# Patient Record
Sex: Male | Born: 1990 | Race: White | Hispanic: No | Marital: Single | Smoking: Never smoker
Health system: Southern US, Community
[De-identification: ages and names within clinical notes are randomized; demographics above are authoritative.]

## PROBLEM LIST (undated history)

## (undated) DIAGNOSIS — F32A Depression, unspecified: Secondary | ICD-10-CM

## (undated) DIAGNOSIS — Z87898 Personal history of other specified conditions: Secondary | ICD-10-CM

## (undated) DIAGNOSIS — F329 Major depressive disorder, single episode, unspecified: Secondary | ICD-10-CM

## (undated) HISTORY — PX: KNEE SURGERY: SHX244

## (undated) HISTORY — DX: Depression, unspecified: F32.A

## (undated) HISTORY — PX: MOUTH SURGERY: SHX715

## (undated) HISTORY — PX: REFRACTIVE SURGERY: SHX103

---
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
## 2018-06-06 ENCOUNTER — Other Ambulatory Visit: Payer: Self-pay

## 2018-06-06 ENCOUNTER — Ambulatory Visit (INDEPENDENT_AMBULATORY_CARE_PROVIDER_SITE_OTHER): Payer: 59 | Admitting: Osteopathic Medicine

## 2018-06-06 ENCOUNTER — Encounter: Payer: Self-pay | Admitting: Osteopathic Medicine

## 2018-06-06 VITALS — BP 134/82 | HR 69 | Temp 98.6°F | Ht 72.0 in | Wt 222.0 lb

## 2018-06-06 DIAGNOSIS — F418 Other specified anxiety disorders: Secondary | ICD-10-CM

## 2018-06-06 DIAGNOSIS — G8929 Other chronic pain: Secondary | ICD-10-CM

## 2018-06-06 DIAGNOSIS — R002 Palpitations: Secondary | ICD-10-CM | POA: Diagnosis not present

## 2018-06-06 DIAGNOSIS — M25562 Pain in left knee: Secondary | ICD-10-CM | POA: Diagnosis not present

## 2018-06-06 DIAGNOSIS — Z0000 Encounter for general adult medical examination without abnormal findings: Secondary | ICD-10-CM

## 2018-06-06 NOTE — Patient Instructions (Addendum)
Palpitations:  Will get labs today  EKG looks good  Consider heart monitor/ultrasound if labs don't give [place] an obvious cause   General Preventive Care  Most recent routine screening lipids/other labs: ordered today!   Everyone should have blood pressure checked once per year.   Tobacco: don't! Please let me know if you need help quitting!  Alcohol: responsible moderation is ok for most adults - if you have concerns about your alcohol intake, please talk to me!   Exercise: as tolerated to reduce risk of cardiovascular disease and diabetes. Strength training will also prevent osteoporosis.   Mental health: if need for mental health care (medicines, counseling, other), or concerns about moods, please let me know!   Sexual health: if need for STD testing, or if concerns with libido/pain problems, please let me know!   Advanced Directive: Living Will and/or Healthcare Power of Attorney recommended for all adults, regardless of age or health.  Vaccines  Flu vaccine: recommended for almost everyone, every fall/winter  Tetanus booster: Tdap recommended every 10 years.   HPV vaccine: Gardasil recommended up to age 61 to prevent HPV-associated diseases, including certain cancers.  Cancer screenings   Colon cancer screening: recommended for everyone at age 28, but some folks need a colonoscopy sooner if risk factors   Prostate cancer screening: PSA blood test around age 75  Lung cancer screening: not needed for non-smokers  Infection screenings . HIV: recommended screening at least once age 60-65, more often as needed. . Gonorrhea/Chlamydia: screening as needed, though many insurances require testing for anyone on birth control pills. . Hepatitis C: recommended for anyone born 50-1963 . TB: certain at-risk populations, or depending on work requirements and/or travel history

## 2018-06-06 NOTE — Progress Notes (Signed)
HPI: Victor Sandoval is a 28 y.o. male who  has no past medical history on file.  he presents to [facility] today, 06/07/18,  for chief complaint of: New to establish  Requests annual Palpitations     See preventive care reviewed as below.    Additional concerns today include:   Occasional palpitations, typically associated with anxiety.  Seems to be happening more frequently, however.  History of anxiety/depression.  Never has had to be on medications or hospitalized for this issue.  Is not interested in medications or counseling at this time      Past medical, surgical, social and family history reviewed:  There are no active problems to display for this patient.   Past Surgical History:  Procedure Laterality Date  . KNEE SURGERY    . MOUTH SURGERY    . REFRACTIVE SURGERY      Social History   Tobacco Use  . Smoking status: Never Smoker  . Smokeless tobacco: Never Used  Substance Use Topics  . Alcohol use: Yes    Alcohol/week: 5.0 standard drinks    Types: 5 Standard drinks or equivalent per week    Family History  Problem Relation Age of Onset  . High blood pressure Maternal Grandmother   . High blood pressure Maternal Grandfather   . Skin cancer Maternal Grandfather      Current medication list and allergy/intolerance information reviewed:    No current outpatient medications on file.   No current facility-administered medications for this visit.     No Known Allergies    Review of Systems:  Constitutional:  No  fever, no chills, No recent illness, No unintentional weight changes. No significant fatigue.   HEENT: No  headache, no vision change, no hearing change, No sore throat, No  sinus pressure  Cardiac: No  chest pain, No  pressure, + palpitations, No  Orthopnea  Respiratory:  No  shortness of breath. No  Cough  Gastrointestinal: No  abdominal pain, No  nausea, No  vomiting,  No  blood in stool, No   diarrhea, No  constipation   Musculoskeletal: No new myalgia/arthralgia  Skin: No  Rash, No other wounds/concerning lesions  Genitourinary: No  incontinence, No  abnormal genital bleeding, No abnormal genital discharge  Hem/Onc: No  easy bruising/bleeding, No  abnormal lymph node  Endocrine: No cold intolerance,  No heat intolerance. No polyuria/polydipsia/polyphagia   Neurologic: No  weakness, No  dizziness, No  slurred speech/focal weakness/facial droop  Psychiatric: +concerns with depression, + concerns with anxiety, No sleep problems, No mood problems  Exam:  BP 134/82 (BP Location: Left Arm, Patient Position: Sitting, Cuff Size: Normal)   Pulse 69   Temp 98.6 F (37 C) (Oral)   Ht 6' (1.829 m)   Wt 222 lb (100.7 kg)   BMI 30.11 kg/m   Constitutional: VS see above. General Appearance: alert, well-developed, well-nourished, NAD  Eyes: Normal lids and conjunctive, non-icteric sclera  Ears, Nose, Mouth, Throat: MMM, Normal external inspection ears/nares/mouth/lips/gums. TM normal bilaterally. Pharynx/tonsils no erythema, no exudate. Nasal mucosa normal.   Neck: No masses, trachea midline. No thyroid enlargement. No tenderness/mass appreciated. No lymphadenopathy  Respiratory: Normal respiratory effort. no wheeze, no rhonchi, no rales  Cardiovascular: S1/S2 normal, no murmur, no rub/gallop auscultated. RRR. No lower extremity edema.   Gastrointestinal: Nontender, no masses. No hepatomegaly, no splenomegaly. No hernia appreciated. Bowel sounds normal. Rectal exam deferred.   Musculoskeletal: Gait normal. No clubbing/cyanosis of digits.  Neurological: Normal balance/coordination. No tremor. No cranial nerve deficit on limited exam. Motor and sensation intact and symmetric. Cerebellar reflexes intact.   Skin: warm, dry, intact. No rash/ulcer. No concerning nevi or subq nodules on limited exam.    Psychiatric: Normal judgment/insight. Normal mood and affect. Oriented x3.     Results for orders placed or performed in visit on 06/06/18 (from the past 72 hour(s))  CBC     Status: None   Collection Time: 06/06/18 12:19 PM  Result Value Ref Range   WBC 5.5 3.8 - 10.8 Thousand/uL   RBC 5.47 4.20 - 5.80 Million/uL   Hemoglobin 16.1 13.2 - 17.1 g/dL   HCT 98.1 19.1 - 47.8 %   MCV 87.6 80.0 - 100.0 fL   MCH 29.4 27.0 - 33.0 pg   MCHC 33.6 32.0 - 36.0 g/dL   RDW 29.5 62.1 - 30.8 %   Platelets 321 140 - 400 Thousand/uL   MPV 11.9 7.5 - 12.5 fL  COMPLETE METABOLIC PANEL WITH GFR     Status: None   Collection Time: 06/06/18 12:19 PM  Result Value Ref Range   Glucose, Bld 89 65 - 99 mg/dL    Comment: .            Fasting reference interval .    BUN 8 7 - 25 mg/dL   Creat 6.57 8.46 - 9.62 mg/dL   GFR, Est Non African American 96 > OR = 60 mL/min/1.83m2   GFR, Est African American 112 > OR = 60 mL/min/1.59m2   BUN/Creatinine Ratio NOT APPLICABLE 6 - 22 (calc)   Sodium 141 135 - 146 mmol/L   Potassium 4.8 3.5 - 5.3 mmol/L   Chloride 102 98 - 110 mmol/L   CO2 28 20 - 32 mmol/L   Calcium 9.9 8.6 - 10.3 mg/dL   Total Protein 7.4 6.1 - 8.1 g/dL   Albumin 4.9 3.6 - 5.1 g/dL   Globulin 2.5 1.9 - 3.7 g/dL (calc)   AG Ratio 2.0 1.0 - 2.5 (calc)   Total Bilirubin 0.6 0.2 - 1.2 mg/dL   Alkaline phosphatase (APISO) 70 36 - 130 U/L   AST 15 10 - 40 U/L   ALT 14 9 - 46 U/L  Lipid panel     Status: None   Collection Time: 06/06/18 12:19 PM  Result Value Ref Range   Cholesterol 135 <200 mg/dL   HDL 45 > OR = 40 mg/dL   Triglycerides 90 <952 mg/dL   LDL Cholesterol (Calc) 73 mg/dL (calc)    Comment: Reference range: <100 . Desirable range <100 mg/dL for primary prevention;   <70 mg/dL for patients with CHD or diabetic patients  with > or = 2 CHD risk factors. Marland Kitchen LDL-C is now calculated using the Martin-Hopkins  calculation, which is a validated novel method providing  better accuracy than the Friedewald equation in the  estimation of LDL-C.  Horald Pollen et al.  Lenox Ahr. 8413;244(01): 2061-2068  (http://education.[facility].com/faq/FAQ164)    Total CHOL/HDL Ratio 3.0 <5.0 (calc)   Non-HDL Cholesterol (Calc) 90 <027 mg/dL (calc)    Comment: For patients with diabetes plus 1 major ASCVD risk  factor, treating to a non-HDL-C goal of <100 mg/dL  (LDL-C of <25 mg/dL) is considered a therapeutic  option.   TSH     Status: None   Collection Time: 06/06/18 12:19 PM  Result Value Ref Range   TSH 0.72 0.40 - 4.50 mIU/L    No results found.  EKG interpretation: Rate: 66 Rhythm: sinus No ST/T changes concerning for acute ischemia/infarct  Previous EKG none available          ASSESSMENT/PLAN: The primary encounter diagnosis was Annual physical exam. Diagnoses of Palpitation, Depression with anxiety, and Chronic pain of left knee were also pertinent to this visit.   Orders Placed This Encounter  Procedures  . CBC  . COMPLETE METABOLIC PANEL WITH GFR  . Lipid panel  . TSH  . Cardiac event monitor  . EKG 12-Lead      Patient Instructions  Palpitations:  Will get labs today  EKG looks good  Consider heart monitor/ultrasound if labs don't give [place] an obvious cause   General Preventive Care  Most recent routine screening lipids/other labs: ordered today!   Everyone should have blood pressure checked once per year.   Tobacco: don't! Please let me know if you need help quitting!  Alcohol: responsible moderation is ok for most adults - if you have concerns about your alcohol intake, please talk to me!   Exercise: as tolerated to reduce risk of cardiovascular disease and diabetes. Strength training will also prevent osteoporosis.   Mental health: if need for mental health care (medicines, counseling, other), or concerns about moods, please let me know!   Sexual health: if need for STD testing, or if concerns with libido/pain problems, please let me know!   Advanced Directive: Living Will and/or Healthcare Power of  Attorney recommended for all adults, regardless of age or health.  Vaccines  Flu vaccine: recommended for almost everyone, every fall/winter  Tetanus booster: Tdap recommended every 10 years.   HPV vaccine: Gardasil recommended up to age 84 to prevent HPV-associated diseases, including certain cancers.  Cancer screenings   Colon cancer screening: recommended for everyone at age 78, but some folks need a colonoscopy sooner if risk factors   Prostate cancer screening: PSA blood test around age 2  Lung cancer screening: not needed for non-smokers  Infection screenings . HIV: recommended screening at least once age 32-65, more often as needed. . Gonorrhea/Chlamydia: screening as needed, though many insurances require testing for anyone on birth control pills. . Hepatitis C: recommended for anyone born 71-1963 . TB: certain at-risk populations, or depending on work requirements and/or travel history             Visit summary with medication list and pertinent instructions was printed for patient to review. All questions at time of visit were answered - patient instructed to contact office with any additional concerns or updates. ER/RTC precautions were reviewed with the patient.     Please note: voice recognition software was used to produce this document, and typos may escape review. Please contact Dr. Lyn Hollingshead for any needed clarifications.     Follow-up plan: Return in about 1 year (around 06/06/2019) for annual physical - sooner depending on labs / symptoms .

## 2018-06-07 LAB — COMPLETE METABOLIC PANEL WITH GFR
AG RATIO: 2 (calc) (ref 1.0–2.5)
ALT: 14 U/L (ref 9–46)
AST: 15 U/L (ref 10–40)
Albumin: 4.9 g/dL (ref 3.6–5.1)
Alkaline phosphatase (APISO): 70 U/L (ref 36–130)
BUN: 8 mg/dL (ref 7–25)
CO2: 28 mmol/L (ref 20–32)
Calcium: 9.9 mg/dL (ref 8.6–10.3)
Chloride: 102 mmol/L (ref 98–110)
Creat: 1.06 mg/dL (ref 0.60–1.35)
GFR, Est African American: 112 mL/min/{1.73_m2} (ref >=60)
GFR, Est Non African American: 96 mL/min/{1.73_m2} (ref >=60)
GLOBULIN: 2.5 g/dL (ref 1.9–3.7)
Glucose, Bld: 89 mg/dL (ref 65–99)
POTASSIUM: 4.8 mmol/L (ref 3.5–5.3)
Sodium: 141 mmol/L (ref 135–146)
Total Bilirubin: 0.6 mg/dL (ref 0.2–1.2)
Total Protein: 7.4 g/dL (ref 6.1–8.1)

## 2018-06-07 LAB — LIPID PANEL
Cholesterol: 135 mg/dL (ref <200)
HDL: 45 mg/dL (ref >=40)
LDL Cholesterol (Calc): 73 mg/dL (calc)
Non-HDL Cholesterol (Calc): 90 mg/dL (calc) (ref <130)
Total CHOL/HDL Ratio: 3 (calc) (ref <5.0)
Triglycerides: 90 mg/dL (ref <150)

## 2018-06-07 LAB — CBC
HEMATOCRIT: 47.9 % (ref 38.5–50.0)
Hemoglobin: 16.1 g/dL (ref 13.2–17.1)
MCH: 29.4 pg (ref 27.0–33.0)
MCHC: 33.6 g/dL (ref 32.0–36.0)
MCV: 87.6 fL (ref 80.0–100.0)
MPV: 11.9 fL (ref 7.5–12.5)
Platelets: 321 10*3/uL (ref 140–400)
RBC: 5.47 10*6/uL (ref 4.20–5.80)
RDW: 12.6 % (ref 11.0–15.0)
WBC: 5.5 10*3/uL (ref 3.8–10.8)

## 2018-06-07 LAB — TSH: TSH: 0.72 mIU/L (ref 0.40–4.50)

## 2018-09-20 ENCOUNTER — Encounter: Payer: Self-pay | Admitting: Osteopathic Medicine

## 2018-12-08 ENCOUNTER — Encounter: Payer: Self-pay | Admitting: Osteopathic Medicine

## 2018-12-08 ENCOUNTER — Ambulatory Visit (INDEPENDENT_AMBULATORY_CARE_PROVIDER_SITE_OTHER): Payer: 59 | Admitting: Osteopathic Medicine

## 2018-12-08 ENCOUNTER — Other Ambulatory Visit: Payer: Self-pay

## 2018-12-08 VITALS — BP 140/85 | HR 67 | Temp 98.5°F | Wt 180.0 lb

## 2018-12-08 DIAGNOSIS — L659 Nonscarring hair loss, unspecified: Secondary | ICD-10-CM | POA: Diagnosis not present

## 2018-12-08 DIAGNOSIS — Z113 Encounter for screening for infections with a predominantly sexual mode of transmission: Secondary | ICD-10-CM

## 2018-12-08 DIAGNOSIS — R5382 Chronic fatigue, unspecified: Secondary | ICD-10-CM | POA: Diagnosis not present

## 2018-12-08 MED ORDER — FINASTERIDE 1 MG PO TABS: 1.0000 mg | ORAL_TABLET | Freq: Every day | ORAL | 3 refills | Status: DC

## 2018-12-08 MED ORDER — MINOXIDIL FOR MEN 5 % EX FOAM: 1.0000 "application " | Freq: Two times a day (BID) | CUTANEOUS | 11 refills | Status: DC

## 2018-12-08 NOTE — Progress Notes (Signed)
HPI: Victor Sandoval is a 28 y.o. male who  has no past medical history on file.  he presents to [facility] today, 12/08/18,  for chief complaint of: STD screening, testosterone check  Patient interested in STD screening for routine purposes. Patient denies any history of rash, itching, burning in the genital are, mouth or genital ulcers. Would also like testosterone checked given recent fatigue/lack of energy and thinning hair. Patient interested in medication to treat hair loss.    At today's visit 12/08/18 ... PMH, PSH, FH reviewed and updated as needed.  Current medication list and allergy/intolerance hx reviewed and updated as needed. (See remainder of HPI, ROS, Phys Exam below)  No results found.  No results found for this or any previous visit (from the past 72 hour(s)).        ASSESSMENT/PLAN: The encounter diagnosis was Screening for STDs (sexually transmitted diseases).  Ordered STD panel   Will check testosterone, TSH, CMP to evaluate fatigue    Patient will start minoxidil and finasteride for hair loss   Orders Placed This Encounter  Procedures  . C. trachomatis/N. gonorrhoeae RNA  . HIV Antibody (routine testing w rflx)  . RPR  . CBC  . COMPLETE METABOLIC PANEL WITH GFR  . TSH  . Testosterone     Meds ordered this encounter  Medications  . finasteride (PROPECIA) 1 MG tablet    Sig: Take 1 tablet (1 mg total) by mouth daily.    Dispense:  90 tablet    Refill:  3  . Minoxidil (MINOXIDIL FOR MEN) 5 % FOAM    Sig: Apply 1 application topically 2 (two) times daily.    Dispense:  60 g    Refill:  11    There are no Patient Instructions on file for this visit.    Follow-up plan: Return for RECHECK PENDING RESULTS / IF WORSE OR  CHANGE.                                                 ################################################# ################################################# ################################################# #################################################    No outpatient medications have been marked as taking for the 12/08/18 encounter (Office Visit) with Emeterio Reeve, DO.    No Known Allergies     Review of Systems:  Constitutional: No recent illness, +fatigue  Genitourinary: No genital rashes or lesions, no itching, burning, or changes in urination   Musculoskeletal: No new myalgia/arthralgia  Psychiatric: No  concerns with depression, No  concerns with anxiety  Exam:  BP 140/85 (BP Location: Left Arm, Patient Position: Sitting, Cuff Size: Normal)   Pulse 67   Temp 98.5 F (36.9 C) (Oral)   Wt 180 lb (81.6 kg)   BMI 24.41 kg/m   Constitutional: VS see above. General Appearance: alert, well-developed, well-nourished, NAD  Neck: No masses, trachea midline.   Respiratory: Normal respiratory effort.   Musculoskeletal: Gait normal.   Neurological: Normal balance/coordination. No tremor.  Skin: warm, dry, intact.   Psychiatric: Normal judgment/insight. Normal mood and affect. Oriented x3.   Visit summary with medication list and pertinent instructions was printed for patient to review, patient was advised to alert [place] if any updates are needed. All questions at time of visit were answered - patient instructed to contact office with any additional concerns. ER/RTC precautions were reviewed with the patient and understanding verbalized.  Note: Total time spent 25 minutes, greater than 50% of the visit was spent face-to-face counseling and coordinating care for the following: The encounter diagnosis was Screening for STDs (sexually transmitted diseases)..  Please note: voice recognition software was used to produce  this document, and typos may escape review. Please contact Dr. Lyn HollingsheadAlexander for any needed clarifications.    Follow up plan: Return for RECHECK PENDING RESULTS / IF WORSE OR CHANGE.

## 2018-12-09 LAB — C. TRACHOMATIS/N. GONORRHOEAE RNA
C. trachomatis RNA, TMA: NOT DETECTED
N. gonorrhoeae RNA, TMA: NOT DETECTED

## 2018-12-10 LAB — COMPLETE METABOLIC PANEL WITH GFR
AG Ratio: 2 (calc) (ref 1.0–2.5)
ALT: 11 U/L (ref 9–46)
AST: 14 U/L (ref 10–40)
Albumin: 4.7 g/dL (ref 3.6–5.1)
Alkaline phosphatase (APISO): 65 U/L (ref 36–130)
BUN: 7 mg/dL (ref 7–25)
CO2: 29 mmol/L (ref 20–32)
Calcium: 9.9 mg/dL (ref 8.6–10.3)
Chloride: 100 mmol/L (ref 98–110)
Creat: 0.9 mg/dL (ref 0.60–1.35)
GFR, Est African American: 136 mL/min/{1.73_m2} (ref >=60)
GFR, Est Non African American: 117 mL/min/{1.73_m2} (ref >=60)
Globulin: 2.3 g/dL (calc) (ref 1.9–3.7)
Glucose, Bld: 85 mg/dL (ref 65–99)
Potassium: 4.1 mmol/L (ref 3.5–5.3)
Sodium: 139 mmol/L (ref 135–146)
Total Bilirubin: 0.8 mg/dL (ref 0.2–1.2)
Total Protein: 7 g/dL (ref 6.1–8.1)

## 2018-12-10 LAB — CBC
HCT: 49.7 % (ref 38.5–50.0)
Hemoglobin: 16.3 g/dL (ref 13.2–17.1)
MCH: 29.7 pg (ref 27.0–33.0)
MCHC: 32.8 g/dL (ref 32.0–36.0)
MCV: 90.7 fL (ref 80.0–100.0)
MPV: 11.6 fL (ref 7.5–12.5)
Platelets: 316 10*3/uL (ref 140–400)
RBC: 5.48 10*6/uL (ref 4.20–5.80)
RDW: 12.9 % (ref 11.0–15.0)
WBC: 7.6 10*3/uL (ref 3.8–10.8)

## 2018-12-10 LAB — TSH: TSH: 1.25 mIU/L (ref 0.40–4.50)

## 2018-12-10 LAB — RPR: RPR Ser Ql: NONREACTIVE

## 2018-12-10 LAB — HIV ANTIBODY (ROUTINE TESTING W REFLEX): HIV 1&2 Ab, 4th Generation: NONREACTIVE

## 2018-12-10 LAB — TESTOSTERONE: Testosterone: 599 ng/dL (ref 250–827)

## 2019-03-16 ENCOUNTER — Encounter: Payer: Self-pay | Admitting: Osteopathic Medicine

## 2019-03-16 ENCOUNTER — Encounter: Payer: Self-pay | Admitting: Gastroenterology

## 2019-03-16 ENCOUNTER — Telehealth (INDEPENDENT_AMBULATORY_CARE_PROVIDER_SITE_OTHER): Payer: Self-pay | Admitting: Osteopathic Medicine

## 2019-03-16 VITALS — Wt 179.0 lb

## 2019-03-16 DIAGNOSIS — R21 Rash and other nonspecific skin eruption: Secondary | ICD-10-CM

## 2019-03-16 DIAGNOSIS — Z8249 Family history of ischemic heart disease and other diseases of the circulatory system: Secondary | ICD-10-CM

## 2019-03-16 DIAGNOSIS — K625 Hemorrhage of anus and rectum: Secondary | ICD-10-CM

## 2019-03-16 MED ORDER — HYDROCORTISONE ACETATE 25 MG RE SUPP: 25.0000 mg | Freq: Two times a day (BID) | RECTAL | 2 refills | Status: DC | PRN

## 2019-03-16 NOTE — Progress Notes (Signed)
Virtual Visit via Video (App used: MyChart) Note  I connected with      Victor Sandoval on 03/16/19 at 10:16 AM  by a telemedicine application and verified that I am speaking with the correct person using two identifiers.  Patient is at home I am in office   I discussed the limitations of evaluation and management by telemedicine and the availability of in person appointments. The patient expressed understanding and agreed to proceed.  History of Present Illness: Victor Sandoval is a 28 y.o. male who would like to discuss rectal bleed, rash, FH blood clots   Rectal bleeding on and off for about 8 years, sore but not painful.  No history of fissure.  Thinks probably hemorrhoids.  Would like referral to get looked at/fixed.  Rash: Itchy hives-like reaction, not sure what might of triggered it but it is gone at this point.  Sister has sensitivity to lactose that has developed into an allergy, patient would like to get some allergy testing completed.  Family history of blood clots on mom's side, uncle and cousin, unknown if trigger or underlying disorder.  No first-degree relatives with a history of blood clot.  Patient's mom asked him to ask me about this issue  Inquires about Covid testing for asymptomatic    Observations/Objective: Wt 179 lb (81.2 kg)   BMI 24.28 kg/m  BP Readings from Last 3 Encounters:  12/08/18 140/85  06/06/18 134/82   Exam: Normal Speech.  NAD  Lab and Radiology Results No results found for this or any previous visit (from the past 72 hour(s)). No results found.     Assessment and Plan: 28 y.o. male with The primary encounter diagnosis was Rectal bleeding. Diagnoses of Rash and nonspecific skin eruption and Family history of blood clots were also pertinent to this visit.  Advised him to look into the blood clot issue regarding family history.  I do not see any reason to initiate a hypercoagulability work-up in him since no first-degree history, no  known genetic disorder, no personal history of of clotting.  We will go ahead and refer to GI to evaluate rectal bleeding, description is consistent with hemorrhoids, Anusol sent as below.  Will get lab testing for basic food allergy profile.  This might hel [place] in an elimination diet, depending on symptoms/response to elimination diet may consider referral to allergy for skin testing  Advised we can do testing for Covid for asymptomatic patients prior to travel, however we are very short on swabs.  He is advised to try to find a different testing site if possible, let me know if he is not successful and we will try to figure something out.   PDMP not reviewed this encounter. Orders Placed This Encounter  Procedures  . Food Allergy Profile  . Ambulatory referral to Gastroenterology    Referral Priority:   Routine    Referral Type:   Consultation    Referral Reason:   Specialty Services Required    Number of Visits Requested:   1   Meds ordered this encounter  Medications  . hydrocortisone (ANUSOL-HC) 25 MG suppository    Sig: Place 1 suppository (25 mg total) rectally 2 (two) times daily as needed for hemorrhoids.    Dispense:  12 suppository    Refill:  2    Follow Up Instructions: Return if symptoms worsen or fail to improve.    I discussed the assessment and treatment plan with the patient. The patient was provided  an opportunity to ask questions and all were answered. The patient agreed with the plan and demonstrated an understanding of the instructions.   The patient was advised to call back or seek an in-person evaluation if any new concerns, if symptoms worsen or if the condition fails to improve as anticipated.  25 minutes of non-face-to-face time was provided during this encounter.      . . . . . . . . . . . . . Marland Kitchen                   Historical information moved to improve visibility of documentation.  No past medical history on  file. Past Surgical History:  Procedure Laterality Date  . KNEE SURGERY    . MOUTH SURGERY    . REFRACTIVE SURGERY     Social History   Tobacco Use  . Smoking status: Never Smoker  . Smokeless tobacco: Never Used  Substance Use Topics  . Alcohol use: Yes    Alcohol/week: 5.0 standard drinks    Types: 5 Standard drinks or equivalent per week   family history includes High blood pressure in his maternal grandfather and maternal grandmother; Skin cancer in his maternal grandfather.  Medications: Current Outpatient Medications  Medication Sig Dispense Refill  . finasteride (PROPECIA) 1 MG tablet Take 1 tablet (1 mg total) by mouth daily. 90 tablet 3  . Minoxidil (MINOXIDIL FOR MEN) 5 % FOAM Apply 1 application topically 2 (two) times daily. 60 g 11  . hydrocortisone (ANUSOL-HC) 25 MG suppository Place 1 suppository (25 mg total) rectally 2 (two) times daily as needed for hemorrhoids. 12 suppository 2   No current facility-administered medications for this visit.   No Known Allergies

## 2019-03-17 LAB — FOOD ALLERGY PROFILE
Allergen, Salmon, f41: <0.1 kU/L
Almonds: <0.1 kU/L
CLASS: 0
CLASS: 0
CLASS: 0
CLASS: 0
CLASS: 0
CLASS: 0
CLASS: 0
CLASS: 0
CLASS: 0
CLASS: 0
CLASS: 0
Cashew IgE: <0.1 kU/L
Class: 0
Class: 0
Class: 0
Egg White IgE: <0.1 kU/L
Fish Cod: <0.1 kU/L
Hazelnut: <0.1 kU/L
Milk IgE: <0.1 kU/L
Peanut IgE: <0.1 kU/L
Scallop IgE: <0.1 kU/L
Sesame Seed f10: <0.1 kU/L
Shrimp IgE: 0.13 kU/L — ABNORMAL HIGH
Soybean IgE: <0.1 kU/L
Tuna IgE: <0.1 kU/L
Walnut: <0.1 kU/L
Wheat IgE: <0.1 kU/L

## 2019-03-17 LAB — INTERPRETATION:

## 2019-03-27 DIAGNOSIS — J9383 Other pneumothorax: Secondary | ICD-10-CM

## 2019-03-27 HISTORY — DX: Other pneumothorax: J93.83

## 2019-04-16 ENCOUNTER — Other Ambulatory Visit: Payer: Self-pay

## 2019-04-16 ENCOUNTER — Encounter: Payer: Self-pay | Admitting: Gastroenterology

## 2019-04-16 ENCOUNTER — Ambulatory Visit (INDEPENDENT_AMBULATORY_CARE_PROVIDER_SITE_OTHER): Payer: 59 | Admitting: Gastroenterology

## 2019-04-16 VITALS — BP 110/68 | HR 61 | Temp 98.2°F | Ht 72.0 in | Wt 189.5 lb

## 2019-04-16 DIAGNOSIS — K625 Hemorrhage of anus and rectum: Secondary | ICD-10-CM | POA: Diagnosis not present

## 2019-04-16 DIAGNOSIS — R634 Abnormal weight loss: Secondary | ICD-10-CM | POA: Diagnosis not present

## 2019-04-16 DIAGNOSIS — Z01818 Encounter for other preprocedural examination: Secondary | ICD-10-CM | POA: Diagnosis not present

## 2019-04-16 NOTE — Patient Instructions (Signed)
If you are age 27 or older, your body mass index should be between 23-30. Your Body mass index is 25.7 kg/m. If this is out of the aforementioned range listed, please consider follow up with your Primary Care Provider.  If you are age 87 or younger, your body mass index should be between 19-25. Your Body mass index is 25.7 kg/m. If this is out of the aformentioned range listed, please consider follow up with your Primary Care Provider.    You have been scheduled for a colonoscopy. Please follow written instructions given to you at your visit today.  Please pick up your prep supplies at the pharmacy within the next 1-3 days. If you use inhalers (even only as needed), please bring them with you on the day of your procedure. Your physician has requested that you go to www.startemmi.com and enter the access code given to you at your visit today. This web site gives a general overview about your procedure. However, you should still follow specific instructions given to you by our office regarding your preparation for the procedure.  Thank you,  Dr. Lynann Bologna

## 2019-04-16 NOTE — Progress Notes (Signed)
Chief Complaint:   Referring Provider:  Emeterio Reeve, DO      ASSESSMENT AND PLAN;   #1. Rectal bleeding. D/d hoids, colitis, polyps, stercoral ulcers etc, r/o colonic neoplasms or IBD.  #2. Wt loss  Plan: - Proceed with colonoscopy. Discussed risks & benefits. (Risks including rare perforation req laparotomy, bleeding after bx/polypectomy req blood transfusion, rarely missing neoplasms, risks of anesthesia/sedation). Benefits outweigh the risks. Patient agrees to proceed. All the questions were answered. Consent forms given for review.    HPI:    Victor Sandoval is a 29 y.o. male  With rectal bleeding since 2009, getting worse now.  Mostly bright red blood.  At times mixed with the stool and at times dripping.  Has some abdominal discomfort and mild rectal discomfort. CBC was within normal limits. Has occasional history of constipation but not bad. Previously did use nonsteroidals which she has stopped.  Bleeding continues. No fever chills or night sweats.  No skin rash. No family history of IBD or colon cancers.  Has lost 40 pounds in the last 2 months.  Some of the weight loss has been intentional but not entirely.  Has been quite concerned about neoplasms and about weight loss.  Has good appetite.  Denies having any nausea, vomiting, heartburn, melena or regurgitation.  No jaundice dark urine or pale stools.  Does give history suggestive of lactose intolerance.  He would get constipated with cheese and milk.  Rarely would have diarrhea as well.  Has tried Lactaid milk with similar problems.  Was tested negative for food allergies except for shrimp.   Past Medical History:  Diagnosis Date  . Depression     Past Surgical History:  Procedure Laterality Date  . KNEE SURGERY    . MOUTH SURGERY     wisdom tooth and tongue tied  . REFRACTIVE SURGERY      Family History  Problem Relation Age of Onset  . High blood pressure Maternal Grandmother   . High blood  pressure Maternal Grandfather   . Skin cancer Maternal Grandfather   . Clotting disorder Maternal Uncle   . Colon cancer Neg Hx   . Esophageal cancer Neg Hx     Social History   Tobacco Use  . Smoking status: Never Smoker  . Smokeless tobacco: Never Used  Substance Use Topics  . Alcohol use: Not Currently    Alcohol/week: 5.0 standard drinks    Types: 5 Standard drinks or equivalent per week  . Drug use: Never    Current Outpatient Medications  Medication Sig Dispense Refill  . finasteride (PROPECIA) 1 MG tablet Take 1 tablet (1 mg total) by mouth daily. 90 tablet 3  . Minoxidil (MINOXIDIL FOR MEN) 5 % FOAM Apply 1 application topically 2 (two) times daily. 60 g 11  . hydrocortisone (ANUSOL-HC) 25 MG suppository Place 1 suppository (25 mg total) rectally 2 (two) times daily as needed for hemorrhoids. (Patient not taking: Reported on 04/16/2019) 12 suppository 2   No current facility-administered medications for this visit.    No Known Allergies  Review of Systems:  Constitutional: Denies fever, chills, diaphoresis, appetite change and fatigue.  HEENT: Denies photophobia, eye pain, redness, hearing loss, ear pain, congestion, sore throat, rhinorrhea, sneezing, mouth sores, neck pain, neck stiffness and tinnitus.   Respiratory: Denies SOB, DOE, cough, chest tightness,  and wheezing.   Cardiovascular: Denies chest pain, palpitations and leg swelling.  Genitourinary: Denies dysuria, urgency, frequency, hematuria, flank pain and difficulty urinating.  Musculoskeletal: Denies myalgias, back pain, joint swelling, arthralgias and gait problem.  Skin: No rash.  Neurological: Denies dizziness, seizures, syncope, weakness, light-headedness, numbness and headaches.  Hematological: Denies adenopathy. Easy bruising, personal or family bleeding history  Psychiatric/Behavioral: No anxiety or depression     Physical Exam:    BP 110/68   Pulse 61   Temp 98.2 F (36.8 C)   Ht 6' (1.829  m)   Wt 189 lb 8 oz (86 kg)   BMI 25.70 kg/m  Wt Readings from Last 3 Encounters:  04/16/19 189 lb 8 oz (86 kg)  03/16/19 179 lb (81.2 kg)  12/08/18 180 lb (81.6 kg)   Constitutional:  Well-developed, in no acute distress. Psychiatric: Normal mood and affect. Behavior is normal. HEENT: Pupils normal.  Conjunctivae are normal. No scleral icterus. Neck supple.  Cardiovascular: Normal rate, regular rhythm. No edema Pulmonary/chest: Effort normal and breath sounds normal. No wheezing, rales or rhonchi. Abdominal: Soft, nondistended. Nontender. Bowel sounds active throughout. There are no masses palpable. No hepatomegaly. Rectal: To be performed at the time of colonoscopy. Neurological: Alert and oriented to person place and time. Skin: Skin is warm and dry. No rashes noted.  Data Reviewed: I have personally reviewed following labs and imaging studies  CBC: CBC Latest Ref Rng & Units 12/09/2018 06/06/2018  WBC 3.8 - 10.8 Thousand/uL 7.6 5.5  Hemoglobin 13.2 - 17.1 g/dL 01.7 51.0  Hematocrit 25.8 - 50.0 % 49.7 47.9  Platelets 140 - 400 Thousand/uL 316 321    CMP: CMP Latest Ref Rng & Units 12/09/2018 06/06/2018  Glucose 65 - 99 mg/dL 85 89  BUN 7 - 25 mg/dL 7 8  Creatinine 5.27 - 1.35 mg/dL 7.82 4.23  Sodium 536 - 146 mmol/L 139 141  Potassium 3.5 - 5.3 mmol/L 4.1 4.8  Chloride 98 - 110 mmol/L 100 102  CO2 20 - 32 mmol/L 29 28  Calcium 8.6 - 10.3 mg/dL 9.9 9.9  Total Protein 6.1 - 8.1 g/dL 7.0 7.4  Total Bilirubin 0.2 - 1.2 mg/dL 0.8 0.6  AST 10 - 40 U/L 14 15  ALT 9 - 46 U/L 11 14     Edman Circle, MD 04/16/2019, 11:26 AM  Cc: Sunnie Nielsen, DO

## 2019-04-17 ENCOUNTER — Ambulatory Visit (INDEPENDENT_AMBULATORY_CARE_PROVIDER_SITE_OTHER): Payer: 59

## 2019-04-17 DIAGNOSIS — Z1159 Encounter for screening for other viral diseases: Secondary | ICD-10-CM

## 2019-04-20 LAB — SARS CORONAVIRUS 2 (TAT 6-24 HRS): SARS Coronavirus 2: NEGATIVE

## 2019-04-21 ENCOUNTER — Encounter: Payer: Self-pay | Admitting: Gastroenterology

## 2019-04-21 ENCOUNTER — Ambulatory Visit (AMBULATORY_SURGERY_CENTER): Payer: 59 | Admitting: Gastroenterology

## 2019-04-21 ENCOUNTER — Other Ambulatory Visit: Payer: Self-pay

## 2019-04-21 VITALS — BP 102/66 | HR 61 | Temp 97.1°F | Resp 18 | Ht 72.0 in | Wt 189.0 lb

## 2019-04-21 DIAGNOSIS — K648 Other hemorrhoids: Secondary | ICD-10-CM

## 2019-04-21 DIAGNOSIS — K625 Hemorrhage of anus and rectum: Secondary | ICD-10-CM | POA: Diagnosis not present

## 2019-04-21 MED ORDER — SODIUM CHLORIDE 0.9 % IV SOLN: 500.0000 mL | Freq: Once | INTRAVENOUS | Status: DC

## 2019-04-21 MED ORDER — HYDROCORTISONE (PERIANAL) 2.5 % EX CREA: 1.0000 "application " | TOPICAL_CREAM | Freq: Two times a day (BID) | CUTANEOUS | 1 refills | Status: AC

## 2019-04-21 NOTE — Progress Notes (Signed)
Report to PACU, RN, vss, BBS= Clear.  

## 2019-04-21 NOTE — Patient Instructions (Signed)
YOU HAD AN ENDOSCOPIC PROCEDURE TODAY AT THE [facility]:   Refer to the procedure report that was given to you for any specific questions about what was found during the examination.  If the procedure report does not answer your questions, please call your gastroenterologist to clarify.  If you requested that your care partner not be given the details of your procedure findings, then the procedure report has been included in a sealed envelope for you to review at your convenience later.  YOU SHOULD EXPECT: Some feelings of bloating in the abdomen. Passage of more gas than usual.  Walking can help get rid of the air that was put into your GI tract during the procedure and reduce the bloating. If you had a lower endoscopy (such as a colonoscopy or flexible sigmoidoscopy) you may notice spotting of blood in your stool or on the toilet paper. If you underwent a bowel prep for your procedure, you may not have a normal bowel movement for a few days.  Please Note:  You might notice some irritation and congestion in your nose or some drainage.  This is from the oxygen used during your procedure.  There is no need for concern and it should clear up in a day or so.  SYMPTOMS TO REPORT IMMEDIATELY:   Following lower endoscopy (colonoscopy or flexible sigmoidoscopy):  Excessive amounts of blood in the stool  Significant tenderness or worsening of abdominal pains  Swelling of the abdomen that is new, acute  Fever of 100F or higher   For urgent or emergent issues, a gastroenterologist can be reached at any hour by calling [phone].   DIET:  We do recommend a small meal at first, but then you may proceed to your regular diet.  Drink plenty of fluids but you should avoid alcoholic beverages for 24 hours.  MEDICATIONS: Use Benefiber one teaspoon by mouth daily. Use HC cream 2.5%: Apply externally twice daily as needed for 7 days for any further bleeding. If bleeding persists, please get in  touch with [place].  Please see handouts given to you by your recovery nurse.  ACTIVITY:  You should plan to take it easy for the rest of today and you should NOT DRIVE or use heavy machinery until tomorrow (because of the sedation medicines used during the test).    FOLLOW UP: Our staff will call the number listed on your records 48-72 hours following your procedure to check on you and address any questions or concerns that you may have regarding the information given to you following your procedure. If we do not reach you, we will leave a message.  We will attempt to reach you two times.  During this call, we will ask if you have developed any symptoms of COVID 19. If you develop any symptoms (ie: fever, flu-like symptoms, shortness of breath, cough etc.) before then, please call [phone].  If you test positive for Covid 19 in the 2 weeks post procedure, please call and report this information to [place].    If any biopsies were taken you will be contacted by phone or by letter within the next 1-3 weeks.  Please call us at [phone] if you have not heard about the biopsies in 3 weeks.   Thank you for allowing [place] to provide for your healthcare needs today.   SIGNATURES/CONFIDENTIALITY: You and/or your care partner have signed paperwork which will be entered into your electronic medical record.  These signatures attest to the fact  that that the information above on your After Visit Summary has been reviewed and is understood.  Full responsibility of the confidentiality of this discharge information lies with you and/or your care-partner.

## 2019-04-21 NOTE — Op Note (Signed)
[facility] Patient Name: Victor Sandoval Procedure Date: 04/21/2019 1:30 PM MRN: [id number] Endoscopist: Lynann Bologna , MD Age: 29 Referring MD:  Date of Birth: 07-25-1990 Gender: Male Account #: [id number] Procedure:                Colonoscopy Indications:              Rectal bleeding Medicines:                Monitored Anesthesia Care Procedure:                Pre-Anesthesia Assessment:                           - Prior to the procedure, a History and Physical                            was performed, and patient medications and                            allergies were reviewed. The patient's tolerance of                            previous anesthesia was also reviewed. The risks                            and benefits of the procedure and the sedation                            options and risks were discussed with the patient.                            All questions were answered, and informed consent                            was obtained. Prior Anticoagulants: The patient has                            taken no previous anticoagulant or antiplatelet                            agents. ASA Grade Assessment: I - A normal, healthy                            patient. After reviewing the risks and benefits,                            the patient was deemed in satisfactory condition to                            undergo the procedure.                           After obtaining informed consent, the colonoscope  was passed under direct vision. Throughout the                            procedure, the patient's blood pressure, pulse, and                            oxygen saturations were monitored continuously. The                            Colonoscope was introduced through the anus and                            advanced to the 4 cm into the ileum. The                            colonoscopy was performed without difficulty. The        patient tolerated the procedure well. The quality                            of the bowel preparation was good. The terminal                            ileum, ileocecal valve, appendiceal orifice, and                            rectum were photographed. Scope In: 1:39:11 PM Scope Out: 1:52:17 PM Scope Withdrawal Time: 0 hours 9 minutes 23 seconds  Total Procedure Duration: 0 hours 13 minutes 6 seconds  Findings:                 The colon (entire examined portion) appeared normal.                           Non-bleeding internal hemorrhoids were found during                            retroflexion. The hemorrhoids were small. No                            fissures were noted.                           The terminal ileum appeared normal.                           The exam was otherwise without abnormality on                            direct and retroflexion views. Complications:            No immediate complications. Estimated Blood Loss:     Estimated blood loss: none. Impression:               -Minimal internal hemorrhoids.                           -  Otherwise normal colonoscopy to TI. Recommendation:           - Patient has a contact number available for                            emergencies. The signs and symptoms of potential                            delayed complications were discussed with the                            patient. Return to normal activities tomorrow.                            Written discharge instructions were provided to the                            patient.                           - Resume previous diet.                           - Use Benefiber one teaspoon PO daily.                           - Use HC Cream 2.5%: Apply externally BID PRN for 7                            days for any further bleeding. If the bleeding                            persists, please get in touch with [place].                           - Return to GI clinic PRN.                            - The findings and recommendations were discussed                            with the patient's family.                           - Repeat colonoscopy at age 37. Earlier, if any                            change in family history or any new problems. Lynann Bologna, MD 04/21/2019 1:57:39 PM This report has been signed electronically.

## 2019-04-21 NOTE — Progress Notes (Signed)
Temp-JB VS-CW  Pt's states no medical or surgical changes since previsit or office visit.  

## 2019-04-23 ENCOUNTER — Telehealth: Payer: Self-pay | Admitting: *Deleted

## 2019-04-23 ENCOUNTER — Telehealth: Payer: Self-pay

## 2019-04-23 NOTE — Telephone Encounter (Signed)
Left message on follow up call. 

## 2019-04-23 NOTE — Telephone Encounter (Signed)
1. Have you developed a fever since your procedure? no  2.   Have you had an respiratory symptoms (SOB or cough) since your procedure? no  3.   Have you tested positive for COVID 19 since your procedure no  4.   Have you had any family members/close contacts diagnosed with the COVID 19 since your procedure?  no   If yes to any of these questions please route to Laverna Peace, RN and Jennye Boroughs, Charity fundraiser.     Follow up Call-  Call back number 04/21/2019  Post procedure Call Back phone  # [phone]  Permission to leave phone message Yes  Some recent data might be hidden     Patient questions:  Do you have a fever, pain , or abdominal swelling? No. Pain Score  0 *  Have you tolerated food without any problems? Yes.    Have you been able to return to your normal activities? Yes.    Do you have any questions about your discharge instructions: Diet   No. Medications  No. Follow up visit  No.  Do you have questions or concerns about your Care? No.  Actions: * If pain score is 4 or above: No action needed, pain <4.

## 2019-05-19 ENCOUNTER — Other Ambulatory Visit: Payer: Self-pay

## 2019-05-19 ENCOUNTER — Ambulatory Visit (INDEPENDENT_AMBULATORY_CARE_PROVIDER_SITE_OTHER)
Admission: RE | Admit: 2019-05-19 | Discharge: 2019-05-19 | Disposition: A | Discharge status: Home / Self Care | Payer: 59 | Source: Ambulatory Visit

## 2019-05-19 DIAGNOSIS — Z20822 Contact with and (suspected) exposure to covid-19: Secondary | ICD-10-CM

## 2019-05-19 MED ORDER — FLUTICASONE PROPIONATE 50 MCG/ACT NA SUSP: 1.0000 | Freq: Every day | NASAL | 0 refills | Status: DC

## 2019-05-19 MED ORDER — ZYRTEC ALLERGY 10 MG PO CAPS: 10.0000 mg | ORAL_CAPSULE | ORAL | 0 refills | Status: DC

## 2019-05-19 NOTE — Discharge Instructions (Addendum)
Patient was advised to go to [facility] health urgent care at [facility] to have a COVID-19 test completed    COVID testing ordered.  It will take between 2-7 days for test results.  Someone will contact you regarding abnormal results.    In the meantime: You should remain isolated in your home for 10 days from symptom onset AND greater than 72 hours after symptoms resolution (absence of fever without the use of fever-reducing medication and improvement in respiratory symptoms), whichever is longer Get plenty of rest and push fluids Zyrtec was prescribed Flonase prescribed for nasal congestion and runny nose Use medications daily for symptom relief Use OTC medications like ibuprofen or tylenol as needed fever or pain Call or go to the ED if you have any new or worsening symptoms such as fever, worsening cough, shortness of breath, chest tightness, chest pain, turning blue, changes in mental status, etc..Marland Kitchen

## 2019-05-19 NOTE — ED Provider Notes (Signed)
Virtual Visit via Video Note:  Millie Shorb  initiated request for Telemedicine visit with [facility] team. I connected with [name]  on 05/19/2019 at 3:40 PM  for a synchronized telemedicine visit using a video enabled HIPPA compliant telemedicine application. I verified that I am speaking with [name]  using two identifiers. Durward Parcel, FNP  was physically located in a [facility] Urgent care site and Ziair Penson was located at a different location.   The limitations of evaluation and management by telemedicine as well as the availability of in-person appointments were discussed. Patient was informed that he  may incur a bill ( including co-pay) for this virtual visit encounter. [name]  expressed understanding and gave verbal consent to proceed with virtual visit.     History of Present Illness:Victor Sandoval  is a 29 y.o. male who presented via telehealth for complaint of fever, fatigue, sore throat, body aches for the past 2 to 3 days.  Reported highest fever has been 102 F.  Has been taking NyQuil with mild relief.  Denies sick exposure to COVID, flu or strep.  Denies recent travel.  Denies aggravating or alleviating symptoms.  Denies previous COVID infection.   Denies nasal congestion, rhinorrhea,  cough, SOB, wheezing, chest pain, nausea, vomiting, changes in bowel or bladder habits.    Past Medical History:  Diagnosis Date  . Depression     No Known Allergies      Observations/Objective:VITALS: Per patient if applicable, see vitals. GENERAL: Alert, appears well and in no acute distress. HEENT: Atraumatic, conjunctiva clear, no obvious abnormalities on inspection of external nose and ears. NECK: Normal movements of the head and neck. CARDIOPULMONARY: No increased WOB. Speaking in clear sentences. I:E ratio WNL.  MS: Moves all visible extremities without noticeable abnormality. PSYCH: Pleasant and cooperative, well-groomed. Speech normal rate and  rhythm. Affect is appropriate. Insight and judgement are appropriate. Attention is focused, linear, and appropriate.  NEURO: CN grossly intact. Oriented as arrived to appointment on time with no prompting. Moves both UE equally.  SKIN: No obvious lesions, wounds, erythema, or cyanosis noted on face or hands.     Assessment and Plan: COVID-19 suspect Patient was advised to go to the nearest [facility] health the urgent care to have a COVID-19 test completed. Flonase was prescribed Zyrtec was prescribed Work note was given  Follow Up Instructions: Follow-up with [place] urgent care at [facility] for a nursing visit to have a Covid test completed Follow-up with primary care Go to ED for worsening of symptoms   I discussed the assessment and treatment plan with the patient. The patient was provided an opportunity to ask questions and all were answered. The patient agreed with the plan and demonstrated an understanding of the instructions.   The patient was advised to call back or seek an in-person evaluation if the symptoms worsen or if the condition fails to improve as anticipated.  I provided 15 minutes of non-face-to-face time during this encounter.    Durward Parcel, FNP  05/19/2019 3:40 PM         Durward Parcel, FNP 05/19/19 1540

## 2019-06-08 ENCOUNTER — Other Ambulatory Visit: Payer: Self-pay

## 2019-06-08 ENCOUNTER — Ambulatory Visit (INDEPENDENT_AMBULATORY_CARE_PROVIDER_SITE_OTHER): Payer: 59 | Admitting: Osteopathic Medicine

## 2019-06-08 VITALS — BP 125/78 | HR 71 | Temp 98.4°F | Wt 195.0 lb

## 2019-06-08 DIAGNOSIS — Z0000 Encounter for general adult medical examination without abnormal findings: Secondary | ICD-10-CM

## 2019-06-08 NOTE — Patient Instructions (Signed)
General Preventive Care  Most recent routine screening labs: ordered today   Everyone should have blood pressure checked once per year.   Tobacco: don't!   Alcohol: responsible moderation is ok for most adults - if you have concerns about your alcohol intake, please talk to me!   Exercise: as tolerated to reduce risk of cardiovascular disease and diabetes. Strength training will also prevent osteoporosis.   Mental health: if need for mental health care (medicines, counseling, other), or concerns about moods, please let me know!   Sexual health: if need for STD testing, or if concerns with libido/pain problems, please let me know!   Advanced Directive: Living Will and/or Healthcare Power of Attorney recommended for all adults, regardless of age or health.  Vaccines  Flu vaccine: recommended for almost everyone, every fall.   Shingles vaccine: Shingrix after age 59.   Pneumonia vaccines: Prevnar and Pneumovax after age 22  Tetanus booster: Tdap every 10 years.   COVID: recommended as soon as you're eligible! Please follow [email or web address] for updates on eligibility and vaccination appointment. As of now, we do not anticipate our clinic having vaccines anytime soon.  Cancer screenings   Colon cancer screening: recommended for everyone at age 37  Prostate cancer screening: PSA blood test around age 11  Lung cancer screening: not needed for non-smoking Infection screenings . HIV, Gonorrhea/Chlamydia: screening as needed . Hepatitis C: recommended for anyone born 72-1963 . TB: certain at-risk populations, or depending on work requirements and/or travel history Other . Bone Density Test: recommended for men at age 19, sooner depending on risk factors . Abdominal Aortic Aneurysm: screening with ultrasound recommended once for men age 51-75 who have ever smoked

## 2019-06-08 NOTE — Progress Notes (Signed)
HPI: Victor Sandoval is a 29 y.o. male who  has a past medical history of Depression.  he presents to [facility] today, 06/08/19,  for chief complaint of: Annual physical   See preventive care reviewed as below.   Additional concerns today include:  None PHQ9 a bit elevated d/t recent death in the family       Past medical, surgical, social and family history reviewed:  There are no problems to display for this patient.   Past Surgical History:  Procedure Laterality Date  . KNEE SURGERY    . MOUTH SURGERY     wisdom tooth and tongue tied  . REFRACTIVE SURGERY      Social History   Tobacco Use  . Smoking status: Never Smoker  . Smokeless tobacco: Never Used  Substance Use Topics  . Alcohol use: Not Currently    Alcohol/week: 5.0 standard drinks    Types: 5 Standard drinks or equivalent per week    Family History  Problem Relation Age of Onset  . High blood pressure Maternal Grandmother   . High blood pressure Maternal Grandfather   . Skin cancer Maternal Grandfather   . Clotting disorder Maternal Uncle   . Colon cancer Neg Hx   . Esophageal cancer Neg Hx   . Stomach cancer Neg Hx   . Rectal cancer Neg Hx      Current medication list and allergy/intolerance information reviewed:    Current Outpatient Medications  Medication Sig Dispense Refill  . finasteride (PROPECIA) 1 MG tablet Take 1 tablet (1 mg total) by mouth daily. 90 tablet 3  . hydrocortisone (ANUSOL-HC) 25 MG suppository Place 1 suppository (25 mg total) rectally 2 (two) times daily as needed for hemorrhoids. 12 suppository 2  . Minoxidil (MINOXIDIL FOR MEN) 5 % FOAM Apply 1 application topically 2 (two) times daily. (Patient not taking: Reported on 04/21/2019) 60 g 11   Current Facility-Administered Medications  Medication Dose Route Frequency Provider Last Rate Last Admin  . 0.9 %  sodium chloride infusion  500 mL Intravenous Once Jackquline Denmark, MD        No  Known Allergies    Review of Systems:  Constitutional:  No  fever, no chills, No recent illness, No unintentional weight changes. No significant fatigue.   HEENT: No  headache, no vision change, no hearing change, No sore throat, No  sinus pressure  Cardiac: No  chest pain, No  pressure, No palpitations, No  Orthopnea  Respiratory:  No  shortness of breath. No  Cough  Gastrointestinal: No  abdominal pain, No  nausea, No  vomiting,  No  blood in stool, No  diarrhea, No  constipation   Musculoskeletal: No new myalgia/arthralgia  Skin: No  Rash, No other wounds/concerning lesions  Genitourinary: No  incontinence, No  abnormal genital bleeding, No abnormal genital discharge  Hem/Onc: No  easy bruising/bleeding, No  abnormal lymph node  Endocrine: No cold intolerance,  No heat intolerance. No polyuria/polydipsia/polyphagia   Neurologic: No  weakness, No  dizziness, No  slurred speech/focal weakness/facial droop  Psychiatric: +concerns with depression, No  concerns with anxiety, No sleep problems, No mood problems  Exam:  BP 125/78 (BP Location: Left Arm, Patient Position: Sitting, Cuff Size: Normal)   Pulse 71   Temp 98.4 F (36.9 C) (Oral)   Wt 195 lb 0.6 oz (88.5 kg)   BMI 26.45 kg/m   Constitutional: VS see above. General Appearance: alert, well-developed, well-nourished, NAD  Eyes: Normal lids and conjunctive, non-icteric sclera  Neck: No masses, trachea midline. No thyroid enlargement. No tenderness/mass appreciated. No lymphadenopathy  Respiratory: Normal respiratory effort. no wheeze, no rhonchi, no rales  Cardiovascular: S1/S2 normal, no murmur, no rub/gallop auscultated. RRR. No lower extremity edema.   Gastrointestinal: Nontender, no masses. No hepatomegaly, no splenomegaly. No hernia appreciated. Bowel sounds normal. Rectal exam deferred.   Musculoskeletal: Gait normal. No clubbing/cyanosis of digits.   Neurological: Normal balance/coordination. No tremor. No  cranial nerve deficit on limited exam. Motor and sensation intact and symmetric. Cerebellar reflexes intact.   Skin: warm, dry, intact. No rash/ulcer. No concerning nevi or subq nodules on limited exam.    Psychiatric: Normal judgment/insight. Normal mood and affect. Oriented x3.    No results found for this or any previous visit (from the past 72 hour(s)).  No results found.           ASSESSMENT/PLAN: The encounter diagnosis was Annual physical exam.   Orders Placed This Encounter  Procedures  . CBC  . COMPLETE METABOLIC PANEL WITH GFR  . Lipid panel      Patient Instructions  General Preventive Care  Most recent routine screening labs: ordered today   Everyone should have blood pressure checked once per year.   Tobacco: don't!   Alcohol: responsible moderation is ok for most adults - if you have concerns about your alcohol intake, please talk to me!   Exercise: as tolerated to reduce risk of cardiovascular disease and diabetes. Strength training will also prevent osteoporosis.   Mental health: if need for mental health care (medicines, counseling, other), or concerns about moods, please let me know!   Sexual health: if need for STD testing, or if concerns with libido/pain problems, please let me know!   Advanced Directive: Living Will and/or Healthcare Power of Attorney recommended for all adults, regardless of age or health.  Vaccines  Flu vaccine: recommended for almost everyone, every fall.   Shingles vaccine: Shingrix after age 11.   Pneumonia vaccines: Prevnar and Pneumovax after age 57  Tetanus booster: Tdap every 10 years.   COVID: recommended as soon as you're eligible! Please follow [email or web address] for updates on eligibility and vaccination appointment. As of now, we do not anticipate our clinic having vaccines anytime soon.  Cancer screenings   Colon cancer screening: recommended for everyone at age 53  Prostate cancer screening: PSA  blood test around age 54  Lung cancer screening: not needed for non-smoking Infection screenings . HIV, Gonorrhea/Chlamydia: screening as needed . Hepatitis C: recommended for anyone born 82-1963 . TB: certain at-risk populations, or depending on work requirements and/or travel history Other . Bone Density Test: recommended for men at age 17, sooner depending on risk factors . Abdominal Aortic Aneurysm: screening with ultrasound recommended once for men age 44-75 who have ever smoked        Visit summary with medication list and pertinent instructions was printed for patient to review. All questions at time of visit were answered - patient instructed to contact office with any additional concerns or updates. ER/RTC precautions were reviewed with the patient.     Please note: voice recognition software was used to produce this document, and typos may escape review. Please contact Dr. Lyn Hollingshead for any needed clarifications.     Follow-up plan: Return in about 1 year (around 06/07/2020) for [place] (call week prior to visit for lab orders).

## 2019-06-09 LAB — LIPID PANEL
Cholesterol: 141 mg/dL (ref <200)
HDL: 56 mg/dL (ref >=40)
LDL Cholesterol (Calc): 67 mg/dL (calc)
Non-HDL Cholesterol (Calc): 85 mg/dL (calc) (ref <130)
Total CHOL/HDL Ratio: 2.5 (calc) (ref <5.0)
Triglycerides: 92 mg/dL (ref <150)

## 2019-06-09 LAB — CBC
HCT: 43.9 % (ref 38.5–50.0)
Hemoglobin: 14.7 g/dL (ref 13.2–17.1)
MCH: 29.6 pg (ref 27.0–33.0)
MCHC: 33.5 g/dL (ref 32.0–36.0)
MCV: 88.5 fL (ref 80.0–100.0)
MPV: 10.8 fL (ref 7.5–12.5)
Platelets: 325 10*3/uL (ref 140–400)
RBC: 4.96 10*6/uL (ref 4.20–5.80)
RDW: 12.4 % (ref 11.0–15.0)
WBC: 4.8 10*3/uL (ref 3.8–10.8)

## 2019-06-09 LAB — COMPLETE METABOLIC PANEL WITH GFR
AG Ratio: 1.8 (calc) (ref 1.0–2.5)
ALT: 27 U/L (ref 9–46)
AST: 22 U/L (ref 10–40)
Albumin: 4.2 g/dL (ref 3.6–5.1)
Alkaline phosphatase (APISO): 59 U/L (ref 36–130)
BUN: 7 mg/dL (ref 7–25)
CO2: 29 mmol/L (ref 20–32)
Calcium: 9 mg/dL (ref 8.6–10.3)
Chloride: 105 mmol/L (ref 98–110)
Creat: 0.84 mg/dL (ref 0.60–1.35)
GFR, Est African American: 139 mL/min/{1.73_m2} (ref >=60)
GFR, Est Non African American: 120 mL/min/{1.73_m2} (ref >=60)
Globulin: 2.3 g/dL (calc) (ref 1.9–3.7)
Glucose, Bld: 95 mg/dL (ref 65–99)
Potassium: 4.3 mmol/L (ref 3.5–5.3)
Sodium: 141 mmol/L (ref 135–146)
Total Bilirubin: 0.4 mg/dL (ref 0.2–1.2)
Total Protein: 6.5 g/dL (ref 6.1–8.1)

## 2020-06-08 ENCOUNTER — Encounter: Payer: 59 | Admitting: Osteopathic Medicine

## 2020-06-23 ENCOUNTER — Ambulatory Visit (INDEPENDENT_AMBULATORY_CARE_PROVIDER_SITE_OTHER): Payer: 59 | Admitting: Osteopathic Medicine

## 2020-06-23 ENCOUNTER — Other Ambulatory Visit: Payer: Self-pay

## 2020-06-23 ENCOUNTER — Encounter: Payer: Self-pay | Admitting: Osteopathic Medicine

## 2020-06-23 VITALS — BP 142/76 | HR 73 | Temp 99.7°F | Wt 241.0 lb

## 2020-06-23 DIAGNOSIS — Z23 Encounter for immunization: Secondary | ICD-10-CM | POA: Diagnosis not present

## 2020-06-23 DIAGNOSIS — Z0000 Encounter for general adult medical examination without abnormal findings: Secondary | ICD-10-CM

## 2020-06-23 NOTE — Progress Notes (Signed)
Victor Sandoval is a 30 y.o. male who presents to  [facility]  today, 06/23/20, seeking care for the following:  . Annual physical      ASSESSMENT & PLAN with other pertinent findings:  The primary encounter diagnosis was Annual physical exam. A diagnosis of Need for Tdap vaccination was also pertinent to this visit.    BP Readings from Last 3 Encounters:  06/23/20 (!) 142/76  06/08/19 125/78  04/21/19 102/66     Patient Instructions  General Preventive Care  Most recent routine screening labs: ordered today   Everyone should have blood pressure checked once per year.   Tobacco: don't!   Alcohol: responsible moderation is ok for most adults - if you have concerns about your alcohol intake, please talk to me!   Exercise: as tolerated to reduce risk of cardiovascular disease and diabetes. Strength training will also prevent osteoporosis.   Mental health: if need for mental health care (medicines, counseling, other), or concerns about moods, please let me know!   Sexual health: if need for STD testing, or if concerns with libido/pain problems, please let me know!   Advanced Directive: Living Will and/or Healthcare Power of Attorney recommended for all adults, regardless of age or health.  Vaccines  Flu vaccine: recommended for almost everyone, every fall.   Shingles vaccine: Shingrix after age 33.   Pneumonia vaccines: Prevnar and Pneumovax after age 44  Tetanus booster: Tdap every 10 years.   COVID vaccines: STRONGLY RECOMMENDED.  Cancer screenings   Colon cancer screening: recommended for everyone at age 59  Prostate cancer screening: PSA blood test around age 66  Lung cancer screening: not needed for non-smoking Infection screenings  HIV, Gonorrhea/Chlamydia: screening as needed  Hepatitis C: recommended for anyone born 23-1963  TB: certain at-risk populations, or depending on work requirements  and/or travel history Other  Bone Density Test: recommended for men at age 37  Abdominal Aortic Aneurysm: screening with ultrasound recommended once for men age 17-75 who have ever smoked   Orders Placed This Encounter  Procedures  . Tdap vaccine greater than or equal to 7yo IM  . CBC  . COMPLETE METABOLIC PANEL WITH GFR  . Lipid panel    No orders of the defined types were placed in this encounter.    See below for relevant physical exam findings  See below for recent lab and imaging results reviewed  Medications, allergies, PMH, PSH, SocH, FamH reviewed below    Follow-up instructions: Return in about 1 year (around 06/23/2021) for ANNUAL CHECK-UP - SEE [place] SOONER IF NEEDED.                                        Exam:  BP (!) 142/76 (BP Location: Left Arm, Patient Position: Sitting, Cuff Size: Normal)   Pulse 73   Temp 99.7 F (37.6 C) (Oral)   Wt 241 lb (109.3 kg)   BMI 32.69 kg/m   Constitutional: VS see above. General Appearance: alert, well-developed, well-nourished, NAD  Neck: No masses, trachea midline.   Respiratory: Normal respiratory effort. no wheeze, no rhonchi, no rales  Cardiovascular: S1/S2 normal, no murmur, no rub/gallop auscultated. RRR.   Musculoskeletal: Gait normal. Symmetric and independent movement of all extremities  Abdominal: non-tender, non-distended, no appreciable organomegaly, neg Murphy's, BS [id number]  Neurological: Normal balance/coordination. No tremor.  Skin: warm,  dry, intact.   Psychiatric: Normal judgment/insight. Normal mood and affect. Oriented x3.   No outpatient medications have been marked as taking for the 06/23/20 encounter (Office Visit) with Sunnie Nielsen, DO.   Current Facility-Administered Medications for the 06/23/20 encounter (Office Visit) with Sunnie Nielsen, DO  Medication  . 0.9 %  sodium chloride infusion    No Known Allergies  There are no problems to  display for this patient.   Family History  Problem Relation Age of Onset  . High blood pressure Maternal Grandmother   . High blood pressure Maternal Grandfather   . Skin cancer Maternal Grandfather   . Clotting disorder Maternal Uncle   . Colon cancer Neg Hx   . Esophageal cancer Neg Hx   . Stomach cancer Neg Hx   . Rectal cancer Neg Hx     Social History   Tobacco Use  Smoking Status Never Smoker  Smokeless Tobacco Never Used    Past Surgical History:  Procedure Laterality Date  . KNEE SURGERY    . MOUTH SURGERY     wisdom tooth and tongue tied  . REFRACTIVE SURGERY      Immunization History  Administered Date(s) Administered  . Moderna Sars-Covid-2 Vaccination 07/11/2019, 08/09/2019  . Tdap 06/23/2020    No results found for this or any previous visit (from the past 2160 hour(s)).  No results found.     All questions at time of visit were answered - patient instructed to contact office with any additional concerns or updates. ER/RTC precautions were reviewed with the patient as applicable.   Please note: manual typing as well as voice recognition software may have been used to produce this document - typos may escape review. Please contact Dr. Lyn Hollingshead for any needed clarifications.

## 2020-06-23 NOTE — Patient Instructions (Addendum)
General Preventive Care  Most recent routine screening labs: ordered today   Everyone should have blood pressure checked once per year.   Tobacco: don't!   Alcohol: responsible moderation is ok for most adults - if you have concerns about your alcohol intake, please talk to me!   Exercise: as tolerated to reduce risk of cardiovascular disease and diabetes. Strength training will also prevent osteoporosis.   Mental health: if need for mental health care (medicines, counseling, other), or concerns about moods, please let me know!   Sexual health: if need for STD testing, or if concerns with libido/pain problems, please let me know!   Advanced Directive: Living Will and/or Healthcare Power of Attorney recommended for all adults, regardless of age or health.  Vaccines  Flu vaccine: recommended for almost everyone, every fall.   Shingles vaccine: Shingrix after age 16.   Pneumonia vaccines: Prevnar and Pneumovax after age 55  Tetanus booster: Tdap every 10 years.   COVID vaccines: STRONGLY RECOMMENDED.  Cancer screenings   Colon cancer screening: recommended for everyone at age 87  Prostate cancer screening: PSA blood test around age 43  Lung cancer screening: not needed for non-smoking Infection screenings  HIV, Gonorrhea/Chlamydia: screening as needed  Hepatitis C: recommended for anyone born 65-1963  TB: certain at-risk populations, or depending on work requirements and/or travel history Other  Bone Density Test: recommended for men at age 4  Abdominal Aortic Aneurysm: screening with ultrasound recommended once for men age 10-75 who have ever smoked

## 2020-06-24 LAB — COMPLETE METABOLIC PANEL WITH GFR
AG Ratio: 1.8 (calc) (ref 1.0–2.5)
ALT: 18 U/L (ref 9–46)
AST: 14 U/L (ref 10–40)
Albumin: 4.9 g/dL (ref 3.6–5.1)
Alkaline phosphatase (APISO): 88 U/L (ref 36–130)
BUN: 10 mg/dL (ref 7–25)
CO2: 25 mmol/L (ref 20–32)
Calcium: 9.9 mg/dL (ref 8.6–10.3)
Chloride: 101 mmol/L (ref 98–110)
Creat: 0.99 mg/dL (ref 0.60–1.35)
GFR, Est African American: 120 mL/min/{1.73_m2} (ref >=60)
GFR, Est Non African American: 103 mL/min/{1.73_m2} (ref >=60)
Globulin: 2.7 g/dL (calc) (ref 1.9–3.7)
Glucose, Bld: 84 mg/dL (ref 65–99)
Potassium: 4.3 mmol/L (ref 3.5–5.3)
Sodium: 138 mmol/L (ref 135–146)
Total Bilirubin: 0.6 mg/dL (ref 0.2–1.2)
Total Protein: 7.6 g/dL (ref 6.1–8.1)

## 2020-06-24 LAB — CBC
HCT: 50.3 % — ABNORMAL HIGH (ref 38.5–50.0)
Hemoglobin: 17 g/dL (ref 13.2–17.1)
MCH: 29.1 pg (ref 27.0–33.0)
MCHC: 33.8 g/dL (ref 32.0–36.0)
MCV: 86.1 fL (ref 80.0–100.0)
MPV: 10.9 fL (ref 7.5–12.5)
Platelets: 346 10*3/uL (ref 140–400)
RBC: 5.84 10*6/uL — ABNORMAL HIGH (ref 4.20–5.80)
RDW: 12.8 % (ref 11.0–15.0)
WBC: 8.3 10*3/uL (ref 3.8–10.8)

## 2020-06-24 LAB — LIPID PANEL
Cholesterol: 151 mg/dL (ref <200)
HDL: 38 mg/dL — ABNORMAL LOW (ref >=40)
LDL Cholesterol (Calc): 82 mg/dL (calc)
Non-HDL Cholesterol (Calc): 113 mg/dL (calc) (ref <130)
Total CHOL/HDL Ratio: 4 (calc) (ref <5.0)
Triglycerides: 216 mg/dL — ABNORMAL HIGH (ref <150)

## 2020-07-22 ENCOUNTER — Encounter: Payer: Self-pay | Admitting: Physician Assistant

## 2020-07-22 ENCOUNTER — Telehealth: Payer: 59 | Admitting: Physician Assistant

## 2020-07-22 DIAGNOSIS — R0789 Other chest pain: Secondary | ICD-10-CM | POA: Diagnosis not present

## 2020-07-22 MED ORDER — MELOXICAM 15 MG PO TABS: 15.0000 mg | ORAL_TABLET | Freq: Every day | ORAL | 0 refills | Status: DC

## 2020-07-22 NOTE — Progress Notes (Signed)
Mr. nickie, deren are scheduled for a virtual visit with your provider today.    Just as we do with appointments in the office, we must obtain your consent to participate.  Your consent will be active for this visit and any virtual visit you may have with one of our providers in the next 365 days.    If you have a MyChart account, I can also send a copy of this consent to you electronically.  All virtual visits are billed to your insurance company just like a traditional visit in the office.  As this is a virtual visit, video technology does not allow for your provider to perform a traditional examination.  This may limit your provider's ability to fully assess your condition.  If your provider identifies any concerns that need to be evaluated in person or the need to arrange testing such as labs, EKG, etc, we will make arrangements to do so.    Although advances in technology are sophisticated, we cannot ensure that it will always work on either your end or our end.  If the connection with a video visit is poor, we may have to switch to a telephone visit.  With either a video or telephone visit, we are not always able to ensure that we have a secure connection.   I need to obtain your verbal consent now.   Are you willing to proceed with your visit today?   Tammie Ellsworth has provided verbal consent on 07/22/2020 for a virtual visit (video or telephone).  Piedad Climes, PA-C 07/22/2020  8:32 AM   Virtual Visit via Video   I connected with patient on 07/22/20 at  8:45 AM EDT by a video enabled telemedicine application and verified that I am speaking with the correct person using two identifiers.  Location patient: Home Location provider: [facility] Persons participating in the virtual visit: Patient, Provider  I discussed the limitations of evaluation and management by telemedicine and the availability of in person appointments. The patient expressed understanding and agreed  to proceed.  Subjective:   HPI:  Patient presents via [facility] today complaining of muscular tightness and discomfort of the upper chest, stretching across bilateral chest, from clavicle to clavicle.  States he noted this on waking this morning.  Denies any symptoms last night before bedtime.  Notes resting well without any nighttime awakening.  The pain did not wake him up from sleep.  Denies any left arm pain or posterior neck pain.  Did note some left-sided neck soreness yesterday evening.  Denies any trauma or injury.  Denies change to activity.  Notes that area is tender to touch, especially around his breastbone.  Denies any heartburn or indigestion.  Denies nausea or vomiting.  Denies redness, bruising or swelling of skin.  Denies other skin changes.  Denies any shortness of breath.  Denies fever, chills or URI symptoms.  Denies abdominal pain.  Has remote history of left clavicular fracture.  No issue with this since occurrence.  ROS:   See pertinent positives and negatives per HPI.  There are no problems to display for this patient.   Social History   Tobacco Use  . Smoking status: Never Smoker  . Smokeless tobacco: Never Used  Substance Use Topics  . Alcohol use: Not Currently    Alcohol/week: 5.0 standard drinks    Types: 5 Standard drinks or equivalent per week   No current outpatient medications on file.  Current Facility-Administered Medications:  .  0.9 %  sodium chloride infusion, 500 mL, Intravenous, Once, Lynann Bologna, MD  No Known Allergies  Objective:   There were no vitals taken for this visit.  Patient is well-developed, well-nourished in no acute distress.  Resting comfortably at home.  Head is normocephalic, atraumatic.  No labored breathing. No wheezing or SOB with patient ambulating through house during visit or climbing stairs to look at skin in mirror.  Speech is clear and coherent with logical content.  Patient is alert and oriented at baseline.   Tenderness with palpation over anterior upper chest wall bilaterally.   Assessment and Plan:  1. Musculoskeletal chest pain Patient is alert and oriented x3.  Is talking throughout the conversation even while ambulating up stairs, without any shortness of breath or increase in symptoms.  Pain is reproducible with palpation over anterior chest wall and sternum, as well as with twisting the upper torso.  Giving this and absence of other symptomology, seems completely musculoskeletal in origin, likely costochondritis.  Reassurance given to patient.  Will start on meloxicam once daily to help speed symptom resolution.  Tylenol for breakthrough pain.  Gentle stretching and heating pad reviewed.  Discussed with patient even though this seems completely musculoskeletal in nature, if he notes any not improving symptoms or if anything worsens he needs to be seen in person by his primary care provider or at an urgent care.  Strict ER precautions also given to patient.  Copy of instructions sent to patient's MyChart.  Will forward chart to PCP for FYI.  - meloxicam (MOBIC) 15 MG tablet; Take 1 tablet (15 mg total) by mouth daily.  Dispense: 20 tablet; Refill: 0     Piedad Climes, [place] 07/22/2020

## 2020-07-22 NOTE — Patient Instructions (Signed)
Instructions sent to patients MyChart.

## 2020-07-29 ENCOUNTER — Emergency Department: Admit: 2020-07-29 | Discharge status: Absent without leave | Payer: Self-pay

## 2020-07-29 ENCOUNTER — Emergency Department (INDEPENDENT_AMBULATORY_CARE_PROVIDER_SITE_OTHER): Payer: 59

## 2020-07-29 ENCOUNTER — Emergency Department (INDEPENDENT_AMBULATORY_CARE_PROVIDER_SITE_OTHER)
Admission: EM | Admit: 2020-07-29 | Discharge: 2020-07-29 | Disposition: A | Discharge status: Home / Self Care | Payer: 59 | Source: Home / Self Care | Attending: Family Medicine | Admitting: Family Medicine

## 2020-07-29 ENCOUNTER — Other Ambulatory Visit: Payer: Self-pay

## 2020-07-29 ENCOUNTER — Encounter: Payer: Self-pay | Admitting: Emergency Medicine

## 2020-07-29 DIAGNOSIS — R0789 Other chest pain: Secondary | ICD-10-CM | POA: Diagnosis not present

## 2020-07-29 DIAGNOSIS — M940 Chondrocostal junction syndrome [Tietze]: Secondary | ICD-10-CM

## 2020-07-29 HISTORY — DX: Personal history of other specified conditions: Z87.898

## 2020-07-29 MED ORDER — PREDNISONE 20 MG PO TABS: ORAL_TABLET | ORAL | 0 refills | Status: DC

## 2020-07-29 NOTE — Discharge Instructions (Addendum)
Apply ice pack for 20 to 30 minutes, 3 to 4 times daily  Continue until pain decreases.  May take Tylenol as needed for pain. 

## 2020-07-29 NOTE — ED Provider Notes (Signed)
[facility]    CSN: [id number] Arrival date & time: 07/29/20  0907      History   Chief Complaint Chief Complaint  Patient presents with  . Shoulder Pain    HPI Victor Sandoval is a 30 y.o. male.   Patient reports that he awoke one week ago with tightness in his anterior chest.  He participated in an E-visit resulting in a diagnosis of costochondritis.  He was prescribed meloxicam which provided significant improvement.  Last night however he developed brief sharp left shoulder pain, resolved this morning although sternum pain again present.  He recalls no injury or change in activities.  He denies cough and shortness of breath, and his pain does not radiate.  His chest tightness is worse when supine. Patient notes that he has had recurring benign palpitations since high school.  The history is provided by the patient.  Chest Pain Pain location:  Substernal area Pain quality: sharp   Pain radiates to:  Does not radiate Pain severity:  Mild Onset quality:  Sudden Duration:  1 week Timing:  Constant Progression:  Partially resolved Chronicity:  New Context: movement and at rest   Context: not breathing, not eating and not trauma   Relieved by: Movic. Worsened by:  Movement Associated symptoms: palpitations   Associated symptoms: no abdominal pain, no AICD problem, no anxiety, no back pain, no cough, no diaphoresis, no dysphagia, no fatigue, no fever, no heartburn, no lower extremity edema, no nausea, no shortness of breath and no syncope     Past Medical History:  Diagnosis Date  . Depression   . History of palpitations     There are no problems to display for this patient.   Past Surgical History:  Procedure Laterality Date  . KNEE SURGERY    . MOUTH SURGERY     wisdom tooth and tongue tied  . REFRACTIVE SURGERY         Home Medications    Prior to Admission medications   Medication Sig Start Date End Date Taking? Authorizing Provider  meloxicam  (MOBIC) 15 MG tablet Take 1 tablet (15 mg total) by mouth daily. 07/22/20  Yes Waldon Merl, PA-C  predniSONE (DELTASONE) 20 MG tablet Take one tab by mouth twice daily for 4 days, then one daily for 3 days. Take with food. 07/29/20  Yes Lattie Haw, MD    Family History Family History  Problem Relation Age of Onset  . High blood pressure Maternal Grandmother   . High blood pressure Maternal Grandfather   . Skin cancer Maternal Grandfather   . Clotting disorder Maternal Uncle   . Healthy Mother   . Healthy Father   . Anxiety disorder Sister   . ADD / ADHD Sister   . Depression Sister   . Colon cancer Neg Hx   . Esophageal cancer Neg Hx   . Stomach cancer Neg Hx   . Rectal cancer Neg Hx     Social History Social History   Tobacco Use  . Smoking status: Never Smoker  . Smokeless tobacco: Never Used  Vaping Use  . Vaping Use: Never used  Substance Use Topics  . Alcohol use: Not Currently    Alcohol/week: 5.0 standard drinks    Types: 5 Standard drinks or equivalent per week  . Drug use: Never     Allergies   Patient has no known allergies.   Review of Systems Review of Systems  Constitutional: Negative for activity change, appetite  change, chills, diaphoresis, fatigue and fever.  HENT: Negative for trouble swallowing.   Respiratory: Positive for chest tightness. Negative for cough, shortness of breath, wheezing and stridor.   Cardiovascular: Positive for chest pain and palpitations. Negative for leg swelling and syncope.  Gastrointestinal: Negative for abdominal pain, heartburn and nausea.  Musculoskeletal: Negative for back pain.  Skin: Negative for rash.  All other systems reviewed and are negative.    Physical Exam Triage Vital Signs ED Triage Vitals  Enc Vitals Group     BP 07/29/20 0954 (!) 139/93     Pulse Rate 07/29/20 0954 (!) 117     Resp 07/29/20 0954 16     Temp 07/29/20 0954 99.4 F (37.4 C)     Temp Source 07/29/20 0954 Oral     SpO2  07/29/20 0954 97 %     Weight --      Height --      Head Circumference --      Peak Flow --      Pain Score 07/29/20 0955 6     Pain Loc --      Pain Edu? --      Excl. in GC? --    No data found.  Updated Vital Signs BP (!) 139/93 (BP Location: Right Arm)   Pulse (!) 117   Temp 99.4 F (37.4 C) (Oral)   Resp 16   SpO2 97%   Visual Acuity Right Eye Distance:   Left Eye Distance:   Bilateral Distance:    Right Eye Near:   Left Eye Near:    Bilateral Near:     Physical Exam Vitals and nursing note reviewed.  Constitutional:      General: He is not in acute distress.    Appearance: He is not ill-appearing.  HENT:     Head: Normocephalic.     Right Ear: External ear normal.     Left Ear: External ear normal.     Nose: Nose normal.     Mouth/Throat:     Mouth: Mucous membranes are moist.     Pharynx: Oropharynx is clear.  Eyes:     Conjunctiva/sclera: Conjunctivae normal.     Pupils: Pupils are equal, round, and reactive to light.  Cardiovascular:     Rate and Rhythm: Regular rhythm. Tachycardia present.     Heart sounds: Normal heart sounds. No murmur heard. No friction rub.  Pulmonary:     Effort: Pulmonary effort is normal.     Breath sounds: Normal breath sounds.  Chest:     Chest wall: Tenderness present.       Comments: Distinct point tenderness to palpation upper sternum as noted on diagram.  Palpation there recreates his pain. Abdominal:     Palpations: Abdomen is soft.     Tenderness: There is no abdominal tenderness.  Musculoskeletal:     Left shoulder: Normal. No tenderness. Normal range of motion.     Cervical back: Normal range of motion and neck supple. No tenderness.     Right lower leg: No edema.     Left lower leg: No edema.  Lymphadenopathy:     Cervical: No cervical adenopathy.  Skin:    General: Skin is warm and dry.     Findings: No rash.  Neurological:     Mental Status: He is alert and oriented to person, place, and time.       UC Treatments / Results  Labs (all labs ordered are listed, but only  abnormal results are displayed) Labs Reviewed  TROPONIN I (HIGH SENSITIVITY)    EKG  Rate:  104 BPM PR:  138 msec QT:  330 msec QTcH:  433 msec QRSD:  82 msec QRS axis:  35 degrees Interpretation:  Sinus tachycardia; non-specific T-wave abnormality  Radiology DG Chest 2 View  Result Date: 07/29/2020 CLINICAL DATA:  Upper sternum chest pain and tenderness EXAM: CHEST - 2 VIEW COMPARISON:  None. FINDINGS: The heart size and mediastinal contours are within normal limits. Both lungs are clear. No pleural effusion or pneumothorax. The visualized skeletal structures are unremarkable. IMPRESSION: No acute process the chest. Electronically Signed   By: Guadlupe Spanish M.D.   On: 07/29/2020 11:44    Procedures Procedures (including critical care time)  Medications Ordered in UC Medications - No data to display  Initial Impression / Assessment and Plan / UC Course  I have reviewed the triage vital signs and the nursing notes.  Pertinent labs & imaging results that were available during my care of the patient were reviewed by me and considered in my medical decision making (see chart for details).    Exam consistent with costochondritis. Review of past records reveals no significant interval change in EKG from 06 June 2018.  Patient's pain appears to be completely musculoskeletal.  Begin prednisone burst/taper. As a precaution, will check Troponin I. Followup with Family Doctor if not improved in about two weeks.   Final Clinical Impressions(s) / UC Diagnoses   Final diagnoses:  Costochondritis     Discharge Instructions     Apply ice pack for 20 to 30 minutes, 3 to 4 times daily  Continue until pain decreases.  May take Tylenol as needed for pain.    ED Prescriptions    Medication Sig Dispense Auth. Provider   predniSONE (DELTASONE) 20 MG tablet Take one tab by mouth twice daily for 4 days, then  one daily for 3 days. Take with food. 11 tablet Lattie Haw, MD        Lattie Haw, MD 07/30/20 [phone]

## 2020-07-29 NOTE — ED Triage Notes (Signed)
E-visit one week ago - diagnosed w/ costochondritis - taking meloxicam Shoulder pain (left) started last night  C/o sharp sternal pain - shoulder pain has lessened Hx of palpitations in high school COVID vaccine & booster COVID 11/21

## 2020-07-30 LAB — TROPONIN I: Troponin I: 9 ng/L (ref <=47)

## 2020-08-01 ENCOUNTER — Encounter: Payer: Self-pay | Admitting: Osteopathic Medicine

## 2020-08-08 ENCOUNTER — Emergency Department (INDEPENDENT_AMBULATORY_CARE_PROVIDER_SITE_OTHER): Payer: 59

## 2020-08-08 ENCOUNTER — Other Ambulatory Visit: Payer: Self-pay

## 2020-08-08 ENCOUNTER — Emergency Department (INDEPENDENT_AMBULATORY_CARE_PROVIDER_SITE_OTHER)
Admission: EM | Admit: 2020-08-08 | Discharge: 2020-08-08 | Disposition: A | Discharge status: Home / Self Care | Payer: 59 | Source: Home / Self Care | Attending: Family Medicine | Admitting: Family Medicine

## 2020-08-08 DIAGNOSIS — M25512 Pain in left shoulder: Secondary | ICD-10-CM

## 2020-08-08 DIAGNOSIS — M5412 Radiculopathy, cervical region: Secondary | ICD-10-CM

## 2020-08-08 DIAGNOSIS — M940 Chondrocostal junction syndrome [Tietze]: Secondary | ICD-10-CM

## 2020-08-08 NOTE — ED Provider Notes (Signed)
[facility]    CSN: [id number] Arrival date & time: 08/08/20  8676      History   Chief Complaint Chief Complaint  Patient presents with  . Chest Pain    HPI Victor Sandoval is a 30 y.o. male.   Patient was diagnosed with costochondritis here 10 days ago, treated with prednisone.  He reports that he had resolution of his sternum pain, but it gradually recurred.  He has a new complaint today of awakening with vague dull aching in his left mandible/cheek area, without tenderness to palpation or pain in mouth. He also recalls additional history of recurring numbness/paresthesias since 2006 in his left shoulder/arm that usually occurs after a violent sneeze.  The paresthesias may last for about 30 minutes.  He admits that he had a displaced fracture of his left clavicle in 2006.  During his previous visit he admitted that he has had occasional palpitations since high school.  The history is provided by the patient.    Past Medical History:  Diagnosis Date  . Depression   . History of palpitations     There are no problems to display for this patient.   Past Surgical History:  Procedure Laterality Date  . KNEE SURGERY    . MOUTH SURGERY     wisdom tooth and tongue tied  . REFRACTIVE SURGERY         Home Medications    Prior to Admission medications   Medication Sig Start Date End Date Taking? Authorizing Provider  meloxicam (MOBIC) 15 MG tablet Take 1 tablet (15 mg total) by mouth daily. 07/22/20   Waldon Merl, PA-C    Family History Family History  Problem Relation Age of Onset  . High blood pressure Maternal Grandmother   . High blood pressure Maternal Grandfather   . Skin cancer Maternal Grandfather   . Clotting disorder Maternal Uncle   . Healthy Mother   . Healthy Father   . Anxiety disorder Sister   . ADD / ADHD Sister   . Depression Sister   . Colon cancer Neg Hx   . Esophageal cancer Neg Hx   . Stomach cancer Neg Hx   . Rectal  cancer Neg Hx     Social History Social History   Tobacco Use  . Smoking status: Never Smoker  . Smokeless tobacco: Never Used  Vaping Use  . Vaping Use: Never used  Substance Use Topics  . Alcohol use: Not Currently    Alcohol/week: 5.0 standard drinks    Types: 5 Standard drinks or equivalent per week  . Drug use: Never     Allergies   Patient has no known allergies.   Review of Systems Review of Systems  Constitutional: Negative for activity change, appetite change, chills, diaphoresis, fatigue and fever.  HENT: Negative for congestion, dental problem, facial swelling, mouth sores and trouble swallowing.        Dull aching sensation in left mandibular area  Eyes: Negative.   Respiratory: Positive for chest tightness. Negative for cough, shortness of breath, wheezing and stridor.   Cardiovascular: Positive for chest pain and palpitations. Negative for leg swelling.  Gastrointestinal: Negative.   Genitourinary: Negative.   Musculoskeletal: Negative.   Skin: Negative.   Neurological: Positive for numbness. Negative for dizziness, weakness and headaches.  Hematological: Negative for adenopathy.     Physical Exam Triage Vital Signs ED Triage Vitals  Enc Vitals Group     BP 08/08/20 0829 (!) 131/94  Pulse Rate 08/08/20 0829 (!) 114     Resp 08/08/20 0829 16     Temp 08/08/20 0829 98.5 F (36.9 C)     Temp Source 08/08/20 0829 Oral     SpO2 08/08/20 0829 97 %     Weight 08/08/20 0830 240 lb (108.9 kg)     Height 08/08/20 0830 6' (1.829 m)     Head Circumference --      Peak Flow --      Pain Score 08/08/20 0829 8     Pain Loc --      Pain Edu? --      Excl. in GC? --    No data found.  Updated Vital Signs BP (!) 131/94 (BP Location: Left Arm)   Pulse (!) 114   Temp 98.5 F (36.9 C) (Oral)   Resp 16   Ht 6' (1.829 m)   Wt 108.9 kg   SpO2 97%   BMI 32.55 kg/m   Visual Acuity Right Eye Distance:   Left Eye Distance:   Bilateral Distance:     Right Eye Near:   Left Eye Near:    Bilateral Near:     Physical Exam Vitals and nursing note reviewed.  Constitutional:      General: He is not in acute distress.    Appearance: He is not ill-appearing.  HENT:     Head: Normocephalic.     Jaw: There is normal jaw occlusion. No trismus or tenderness.     Salivary Glands: Left salivary gland is not diffusely enlarged or tender.     Comments: No tenderness to palpation or swelling over left mandible.    Nose: Nose normal.     Mouth/Throat:     Pharynx: Oropharynx is clear.     Comments: No tooth or gingival tenderness to palpation on the left. Eyes:     Extraocular Movements: Extraocular movements intact.     Conjunctiva/sclera: Conjunctivae normal.     Pupils: Pupils are equal, round, and reactive to light.  Neck:     Comments: Patient has increased left jaw/face pain with left neck lateral flexion, and neck extension.   Cardiovascular:     Rate and Rhythm: Regular rhythm. Tachycardia present.     Heart sounds: Normal heart sounds.  Pulmonary:     Breath sounds: Normal breath sounds.  Chest:     Comments: Minimal tenderness to palpation mid-upper sternum compared to previous visit. Abdominal:     Palpations: Abdomen is soft.     Tenderness: There is no abdominal tenderness.  Musculoskeletal:        General: No tenderness.     Cervical back: Normal range of motion and neck supple. No tenderness.     Right lower leg: No edema.     Left lower leg: No edema.  Lymphadenopathy:     Cervical: No cervical adenopathy.  Neurological:     General: No focal deficit present.     Mental Status: He is alert and oriented to person, place, and time.      UC Treatments / Results  Labs (all labs ordered are listed, but only abnormal results are displayed) Labs Reviewed - No data to display  EKG   Radiology DG Cervical Spine Complete  Result Date: 08/08/2020 CLINICAL DATA:  Left shoulder pain 2 weeks EXAM: CERVICAL SPINE -  COMPLETE 4+ VIEW COMPARISON:  None. FINDINGS: There is no evidence of cervical spine fracture or prevertebral soft tissue swelling. Alignment is normal. Intervertebral  disc spaces are preserved. Unremarkable facet joints. Oblique views demonstrate widely patent bony foramina bilaterally. No other significant bone abnormalities are identified. IMPRESSION: Negative cervical spine radiographs. Electronically Signed   By: Duanne Guess D.O.   On: 08/08/2020 10:40    Procedures Procedures (including critical care time)  Medications Ordered in UC Medications - No data to display  Initial Impression / Assessment and Plan / UC Course  I have reviewed the triage vital signs and the nursing notes.  Pertinent labs & imaging results that were available during my care of the patient were reviewed by me and considered in my medical decision making (see chart for details).    ?cervical radiculopathy.  Note normal C-spine Followup with Dr. Rodney [facility] ([facility] for further evaluation and treatment. Appointment tomorrow at 8:30am.   Final Clinical Impressions(s) / UC Diagnoses   Final diagnoses:  Costochondritis  Cervical radiculopathy     Discharge Instructions     May take Ibuprofen 200mg , 4 tabs every 8 hours with food.      ED Prescriptions    None        , MD 08/11/20 1057

## 2020-08-08 NOTE — Discharge Instructions (Addendum)
May take Ibuprofen 200mg, 4 tabs every 8 hours with food.  °

## 2020-08-08 NOTE — ED Triage Notes (Signed)
Chest pain, bottom left molar pain x 2 weeks

## 2020-08-09 ENCOUNTER — Other Ambulatory Visit: Payer: Self-pay | Admitting: Sports Medicine

## 2020-08-09 ENCOUNTER — Ambulatory Visit (INDEPENDENT_AMBULATORY_CARE_PROVIDER_SITE_OTHER): Payer: 59 | Admitting: Sports Medicine

## 2020-08-09 ENCOUNTER — Ambulatory Visit (INDEPENDENT_AMBULATORY_CARE_PROVIDER_SITE_OTHER): Payer: 59

## 2020-08-09 ENCOUNTER — Other Ambulatory Visit: Payer: Self-pay

## 2020-08-09 DIAGNOSIS — R002 Palpitations: Secondary | ICD-10-CM

## 2020-08-09 DIAGNOSIS — R079 Chest pain, unspecified: Secondary | ICD-10-CM

## 2020-08-09 DIAGNOSIS — I493 Ventricular premature depolarization: Secondary | ICD-10-CM | POA: Insufficient documentation

## 2020-08-09 NOTE — Progress Notes (Addendum)
    Procedures performed today:    None.  Independent interpretation of notes and tests performed by another provider:   Chest x-ray personally reviewed, questionable pleural scarring on the right.  Twelve-lead ECG personally reviewed, normal sinus rhythm, rate of 99 bpm, normal axis, questionable biphasic P waves.  Brief History, Exam, Impression, and Recommendations:    Chest pain This is a pleasant 30 year old male without a significant past medical history with exception of some weight gain over the past several weeks to months. Over the past 2 weeks has had increasing pain in his left upper chest with radiation to the jaw and occasionally down the left arm, occasionally it does happen with exertion. It is also pleuritic. He denies any nausea, diaphoresis during these episodes. He has been seen by his PCP, he was seen in urgent care and diagnosed with costochondritis. Today he has no tenderness over his costochondral cartilage. I also reviewed his chest x-ray was which is unremarkable with exception of questionable right-sided pleural-based scarring. Abnormalities on exam do include a 1-2/6 systolic murmur heard best in the left fourth intercostal space. We are going to work this up aggressively before handing him back off to his PCP. CBC, CMP, D-dimer, BNP, Holter monitor due to palpitations. I would like a treadmill stress test as well. Echocardiogram. Return to see me when the work-up is complete.  D-dimer elevated, combined with the above symptoms I really think we need a CT angio. Patient will return ASAP.  Level 5 coded as there is a potential life-threatening process here.  I have also reviewed prior notes from urgent care as well as recent prior labs.   ___________________________________________ Ihor Austin. Benjamin Stain, M.D., ABFM., CAQSM. Primary Care and Sports Medicine [facility]  Adjunct Instructor of Family Medicine  [employer]

## 2020-08-09 NOTE — Addendum Note (Signed)
Addended by: Monica Becton on: 08/09/2020 12:25 PM   Modules accepted: Orders

## 2020-08-09 NOTE — Assessment & Plan Note (Addendum)
This is a pleasant 30 year old male without a significant past medical history with exception of some weight gain over the past several weeks to months. Over the past 2 weeks has had increasing pain in his left upper chest with radiation to the jaw and occasionally down the left arm, occasionally it does happen with exertion. It is also pleuritic. He denies any nausea, diaphoresis during these episodes. He has been seen by his PCP, he was seen in urgent care and diagnosed with costochondritis. Today he has no tenderness over his costochondral cartilage. I also reviewed his chest x-ray was which is unremarkable with exception of questionable right-sided pleural-based scarring. Abnormalities on exam do include a 1-2/6 systolic murmur heard best in the left fourth intercostal space. We are going to work this up aggressively before handing him back off to his PCP. CBC, CMP, D-dimer, BNP, Holter monitor due to palpitations. I would like a treadmill stress test as well. Echocardiogram. Return to see me when the work-up is complete.  D-dimer elevated, combined with the above symptoms I really think we need a CT angio. Patient will return ASAP.

## 2020-08-10 ENCOUNTER — Telehealth: Payer: Self-pay

## 2020-08-10 ENCOUNTER — Ambulatory Visit (INDEPENDENT_AMBULATORY_CARE_PROVIDER_SITE_OTHER): Payer: 59

## 2020-08-10 DIAGNOSIS — R079 Chest pain, unspecified: Secondary | ICD-10-CM | POA: Diagnosis not present

## 2020-08-10 DIAGNOSIS — R791 Abnormal coagulation profile: Secondary | ICD-10-CM

## 2020-08-10 LAB — COMPREHENSIVE METABOLIC PANEL
AG Ratio: 1.8 (calc) (ref 1.0–2.5)
ALT: 17 U/L (ref 9–46)
AST: 12 U/L (ref 10–40)
Albumin: 4.4 g/dL (ref 3.6–5.1)
Alkaline phosphatase (APISO): 81 U/L (ref 36–130)
BUN: 9 mg/dL (ref 7–25)
CO2: 28 mmol/L (ref 20–32)
Calcium: 9.6 mg/dL (ref 8.6–10.3)
Chloride: 101 mmol/L (ref 98–110)
Creat: 0.9 mg/dL (ref 0.60–1.35)
Globulin: 2.5 g/dL (calc) (ref 1.9–3.7)
Glucose, Bld: 94 mg/dL (ref 65–99)
Potassium: 4.3 mmol/L (ref 3.5–5.3)
Sodium: 140 mmol/L (ref 135–146)
Total Bilirubin: 0.4 mg/dL (ref 0.2–1.2)
Total Protein: 6.9 g/dL (ref 6.1–8.1)

## 2020-08-10 LAB — CBC
HCT: 47.8 % (ref 38.5–50.0)
Hemoglobin: 15.8 g/dL (ref 13.2–17.1)
MCH: 28.9 pg (ref 27.0–33.0)
MCHC: 33.1 g/dL (ref 32.0–36.0)
MCV: 87.4 fL (ref 80.0–100.0)
MPV: 11.2 fL (ref 7.5–12.5)
Platelets: 352 10*3/uL (ref 140–400)
RBC: 5.47 10*6/uL (ref 4.20–5.80)
RDW: 12.6 % (ref 11.0–15.0)
WBC: 11.5 10*3/uL — ABNORMAL HIGH (ref 3.8–10.8)

## 2020-08-10 LAB — D-DIMER, QUANTITATIVE: D-Dimer, Quant: 1.22 mcg/mL FEU — ABNORMAL HIGH (ref <0.50)

## 2020-08-10 LAB — BRAIN NATRIURETIC PEPTIDE: Brain Natriuretic Peptide: 17 pg/mL (ref <100)

## 2020-08-10 LAB — TSH: TSH: 0.99 mIU/L (ref 0.40–4.50)

## 2020-08-10 MED ORDER — IOHEXOL 350 MG/ML SOLN
100.0000 mL | Freq: Once | INTRAVENOUS | Status: AC | PRN
  Administered 2020-08-10: 100 mL via INTRAVENOUS

## 2020-08-10 NOTE — Addendum Note (Signed)
Addended by: Monica Becton on: 08/10/2020 08:38 AM   Modules accepted: Orders

## 2020-08-15 ENCOUNTER — Ambulatory Visit (HOSPITAL_BASED_OUTPATIENT_CLINIC_OR_DEPARTMENT_OTHER)
Admission: RE | Admit: 2020-08-15 | Discharge: 2020-08-15 | Disposition: A | Discharge status: Home / Self Care | Payer: 59 | Source: Ambulatory Visit | Attending: Sports Medicine | Admitting: Sports Medicine

## 2020-08-15 ENCOUNTER — Other Ambulatory Visit: Payer: Self-pay

## 2020-08-15 DIAGNOSIS — R002 Palpitations: Secondary | ICD-10-CM

## 2020-08-15 DIAGNOSIS — R079 Chest pain, unspecified: Secondary | ICD-10-CM

## 2020-08-15 LAB — ECHOCARDIOGRAM COMPLETE
AR max vel: 3.93 cm2
AV Area VTI: 4.2 cm2
AV Area mean vel: 4.49 cm2
AV Mean grad: 3 mmHg
AV Peak grad: 7.4 mmHg
Ao pk vel: 1.36 m/s
Area-P 1/2: 4.21 cm2
Calc EF: 72.6 %
S' Lateral: 2.61 cm
Single Plane A2C EF: 73.5 %
Single Plane A4C EF: 71 %

## 2020-08-19 ENCOUNTER — Other Ambulatory Visit: Payer: Self-pay | Admitting: Sports Medicine

## 2020-08-19 DIAGNOSIS — R079 Chest pain, unspecified: Secondary | ICD-10-CM

## 2020-09-01 ENCOUNTER — Ambulatory Visit (INDEPENDENT_AMBULATORY_CARE_PROVIDER_SITE_OTHER): Payer: 59

## 2020-09-01 ENCOUNTER — Other Ambulatory Visit: Payer: Self-pay

## 2020-09-01 DIAGNOSIS — R079 Chest pain, unspecified: Secondary | ICD-10-CM | POA: Diagnosis not present

## 2020-09-01 LAB — EXERCISE TOLERANCE TEST
Estimated workload: 10.4 METS
Exercise duration (min): 9 min
Exercise duration (sec): 0 s
MPHR: 192 {beats}/min
Peak HR: 190 {beats}/min
Percent HR: 98 %
RPE: 17
Rest HR: 99 {beats}/min

## 2020-09-14 ENCOUNTER — Telehealth (INDEPENDENT_AMBULATORY_CARE_PROVIDER_SITE_OTHER): Payer: 59 | Admitting: Sports Medicine

## 2020-09-14 DIAGNOSIS — I493 Ventricular premature depolarization: Secondary | ICD-10-CM

## 2020-09-14 NOTE — Progress Notes (Signed)
   Virtual Visit via WebEx/MyChart   I connected with  Victor Sandoval  on 09/14/20 via WebEx/MyChart/Doximity Video and verified that I am speaking with the correct person using two identifiers.   I discussed the limitations, risks, security and privacy concerns of performing an evaluation and management service by WebEx/MyChart/Doximity Video, including the higher likelihood of inaccurate diagnosis and treatment, and the availability of in person appointments.  We also discussed the likely need of an additional face to face encounter for complete and high quality delivery of care.  I also discussed with the patient that there may be a patient responsible charge related to this service. The patient expressed understanding and wishes to proceed.  Provider location is in medical facility. Patient location is at their home, different from provider location. People involved in care of the patient during this telehealth encounter were myself, my nurse/medical assistant, and my front office/scheduling team member.  Review of Systems: No fevers, chills, night sweats, weight loss, chest pain, or shortness of breath.   Objective Findings:    General: Speaking full sentences, no audible heavy breathing.  Sounds alert and appropriately interactive.  Appears well.  Face symmetric.  Extraocular movements intact.  Pupils equal and round.  No nasal flaring or accessory muscle use visualized.  Independent interpretation of tests performed by another provider:   None.  Brief History, Exam, Impression, and Recommendations:    PVC (premature ventricular contraction) Ramzi is a pleasant 30 year old male, he started having palpitations, also has a history of anxiety. We aggressively worked him up with labs, imaging, he did have a CT angiogram, echocardiogram and Zio patch monitor. Echocardiogram was normal, Zio patch showed PVCs. He did have some dependent atelectasis but also has a history of COVID. At this  juncture his palpitations are mild enough where they are not bothering him enough to take medication, he has a structurally normal heart, functionally normal heart, I think we can simply watch this for now, I gave him the all clear to exercise without restrictions. Follow-up with PCP as needed.   I discussed the above assessment and treatment plan with the patient. The patient was provided an opportunity to ask questions and all were answered. The patient agreed with the plan and demonstrated an understanding of the instructions.   The patient was advised to call back or seek an in-person evaluation if the symptoms worsen or if the condition fails to improve as anticipated.   I provided 15 minutes of face to face and non-face-to-face time during this encounter date, time was needed to gather information, review chart, records, communicate/coordinate with staff remotely, as well as complete documentation.   ___________________________________________ Ihor Austin. Benjamin Stain, M.D., ABFM., CAQSM. Primary Care and Sports Medicine [facility]  Adjunct Instructor of Family Medicine  [employer]

## 2020-09-14 NOTE — Assessment & Plan Note (Signed)
Victor Sandoval is a pleasant 30 year old male, he started having palpitations, also has a history of anxiety. We aggressively worked him up with labs, imaging, he did have a CT angiogram, echocardiogram and Zio patch monitor. Echocardiogram was normal, Zio patch showed PVCs. He did have some dependent atelectasis but also has a history of COVID. At this juncture his palpitations are mild enough where they are not bothering him enough to take medication, he has a structurally normal heart, functionally normal heart, I think we can simply watch this for now, I gave him the all clear to exercise without restrictions. Follow-up with PCP as needed.

## 2020-11-08 NOTE — Telephone Encounter (Signed)
Opened in error

## 2020-12-26 ENCOUNTER — Encounter: Payer: Self-pay | Admitting: Osteopathic Medicine

## 2021-06-26 ENCOUNTER — Ambulatory Visit (INDEPENDENT_AMBULATORY_CARE_PROVIDER_SITE_OTHER): Payer: 59 | Admitting: Medical-Surgical

## 2021-06-26 ENCOUNTER — Encounter: Payer: 59 | Admitting: Osteopathic Medicine

## 2021-06-26 ENCOUNTER — Encounter: Payer: Self-pay | Admitting: Medical-Surgical

## 2021-06-26 VITALS — BP 118/83 | HR 89 | Resp 20 | Ht 72.0 in | Wt 232.4 lb

## 2021-06-26 DIAGNOSIS — D751 Secondary polycythemia: Secondary | ICD-10-CM

## 2021-06-26 DIAGNOSIS — F418 Other specified anxiety disorders: Secondary | ICD-10-CM | POA: Diagnosis not present

## 2021-06-26 DIAGNOSIS — Z0000 Encounter for general adult medical examination without abnormal findings: Secondary | ICD-10-CM

## 2021-06-26 DIAGNOSIS — R41840 Attention and concentration deficit: Secondary | ICD-10-CM

## 2021-06-26 DIAGNOSIS — L600 Ingrowing nail: Secondary | ICD-10-CM

## 2021-06-26 DIAGNOSIS — Z1159 Encounter for screening for other viral diseases: Secondary | ICD-10-CM | POA: Diagnosis not present

## 2021-06-26 DIAGNOSIS — Z818 Family history of other mental and behavioral disorders: Secondary | ICD-10-CM

## 2021-06-26 MED ORDER — ESCITALOPRAM OXALATE 10 MG PO TABS: 10.0000 mg | ORAL_TABLET | Freq: Every day | ORAL | 0 refills | Status: DC

## 2021-06-26 NOTE — Progress Notes (Signed)
HPI: ?Victor Sandoval is a 31 y.o. male who  has a past medical history of Depression and History of palpitations. ? ?he presents to [facility] today, 06/26/21,  ?for chief complaint of: ?Annual physical exam ? ?Dentist: UTD ?Eye exam: LASEK a few years ago, no concerns ?Exercise: A little but looking to get into this more ?Diet: No restrictions ? ?Concerns: ?Bilateral ingrown toenails on the great toes ? ?Hives in random patches with increased stress/heat for the last week ? ?Past medical, surgical, social and family history reviewed: ? ?Patient Active Problem List  ? Diagnosis Date Noted  ? PVC (premature ventricular contraction) 08/09/2020  ? ? ?Past Surgical History:  ?Procedure Laterality Date  ? KNEE SURGERY    ? MOUTH SURGERY    ? wisdom tooth and tongue tied  ? REFRACTIVE SURGERY    ? ? ?Social History  ? ?Tobacco Use  ? Smoking status: Never  ? Smokeless tobacco: Never  ?Substance Use Topics  ? Alcohol use: Yes  ?  Alcohol/week: 5.0 standard drinks  ?  Types: 5 Standard drinks or equivalent per week  ? ? ?Family History  ?Problem Relation Age of Onset  ? High blood pressure Maternal Grandmother   ? High blood pressure Maternal Grandfather   ? Skin cancer Maternal Grandfather   ? Clotting disorder Maternal Uncle   ? Healthy Mother   ? Healthy Father   ? Anxiety disorder Sister   ? ADD / ADHD Sister   ? Depression Sister   ? Colon cancer Neg Hx   ? Esophageal cancer Neg Hx   ? Stomach cancer Neg Hx   ? Rectal cancer Neg Hx   ?  ? ?Current medication list and allergy/intolerance information reviewed:   ? ?Current Outpatient Medications  ?Medication Sig Dispense Refill  ? escitalopram (LEXAPRO) 10 MG tablet Take 1 tablet (10 mg total) by mouth daily. Take 1/2 tablet (5mg ) daily for 8 days then increase to 1 full tablet (10mg ) daily. 90 tablet 0  ? ?No current facility-administered medications for this visit.  ? ? ?No Known Allergies ?  ? ?Review of Systems: ?Constitutional:  No   fever, no chills, No recent illness, No unintentional weight changes. No significant fatigue.  ?HEENT: No  headache, no vision change, no hearing change, No sore throat, No  sinus pressure ?Cardiac: No  chest pain, No  pressure, No palpitations, No  Orthopnea ?Respiratory:  No  shortness of breath. No  Cough ?Gastrointestinal: No  abdominal pain, No  nausea, No  vomiting,  No  blood in stool, No  diarrhea, No  constipation  ?Musculoskeletal: No new myalgia/arthralgia ?Skin: No  Rash, No other wounds/concerning lesions, + bilateral great toe ingrown toenails, recent patchy hives ?Genitourinary: No  incontinence, No  abnormal genital bleeding, No abnormal genital discharge ?Hem/Onc: No  easy bruising/bleeding, No  abnormal lymph node ?Endocrine: No cold intolerance,  No heat intolerance. No polyuria/polydipsia/polyphagia  ?Neurologic: No  weakness, No  dizziness, No  slurred speech/focal weakness/facial droop ?Psychiatric: + Concerns with depression, + concerns with anxiety, No sleep problems, + mood problems ? ?Exam:  ?BP 118/83 (BP Location: Right Arm, Cuff Size: Large)   Pulse 89   Resp 20   Ht 6' (1.829 m)   Wt 232 lb 6.4 oz (105.4 kg)   SpO2 97%   BMI 31.52 kg/m?  ?Constitutional: VS see above. General Appearance: alert, well-developed, well-nourished, NAD ?Eyes: Normal lids and conjunctive, non-icteric sclera ?Ears, Nose, Mouth,  Throat: MMM, Normal external inspection ears/nares/mouth/lips/gums. TM normal bilaterally.   ?Neck: No masses, trachea midline. No thyroid enlargement. No tenderness/mass appreciated. No lymphadenopathy ?Respiratory: Normal respiratory effort. no wheeze, no rhonchi, no rales ?Cardiovascular: S1/S2 normal, no murmur, no rub/gallop auscultated. RRR. No lower extremity edema. Pedal pulse II/IV bilaterally DP and PT. No carotid bruit or JVD. No abdominal aortic bruit. ?Gastrointestinal: Nontender, no masses. No hepatomegaly, no splenomegaly. No hernia appreciated. Bowel sounds normal.  Rectal exam deferred.  ?Musculoskeletal: Gait normal. No clubbing/cyanosis of digits.  ?Neurological: Normal balance/coordination. No tremor. No cranial nerve deficit on limited exam. Motor and sensation intact and symmetric. Cerebellar reflexes intact.  ?Skin: warm, dry, intact. No rash/ulcer. No concerning nevi or subq nodules on limited exam.   ?Psychiatric: Normal judgment/insight. Normal mood and affect. Oriented x3.  ? ? ?ASSESSMENT/PLAN:  ? ?1. Annual physical exam ?Checking labs as below.  Up-to-date on preventative care.  Wellness information provided with AVS. ?- CBC with Differential/Platelet ?- COMPLETE METABOLIC PANEL WITH GFR ?- Lipid panel ? ?2. Need for hepatitis C screening test ?Discussed screening recommendations.  Adding to blood work today. ?- Hepatitis C Antibody ? ?3. Ingrown toenail ?Since this is a recurring issue, referring to podiatry for further management. ?- Ambulatory referral to Podiatry ? ?4. Anxiety with depression ?Referring to behavioral health for counseling.  Also referring to psychology for further evaluation and testing.  Starting Lexapro 5 mg daily x8 days then 10 mg daily.  Discussed timeline for effectiveness and expectations for management of symptoms.  Reviewed strict return precautions and potential side effects of the medication.  Patient verbalized understanding and is agreeable to the plan. ?- Ambulatory referral to Behavioral Health ?- Ambulatory referral to Psychology ? ?5. Inattention ?Referrals as below. ?- Ambulatory referral to Behavioral Health ?- Ambulatory referral to Psychology ? ?6. Family history of autism ?Referrals as below. ?- Ambulatory referral to Behavioral Health ?- Ambulatory referral to Psychology ? ? ?Orders Placed This Encounter  ?Procedures  ? CBC with Differential/Platelet  ? COMPLETE METABOLIC PANEL WITH GFR  ? Lipid panel  ? Hepatitis C Antibody  ? Ambulatory referral to Behavioral Health  ? Ambulatory referral to Psychology  ? Ambulatory  referral to Podiatry  ? ? ?Meds ordered this encounter  ?Medications  ? escitalopram (LEXAPRO) 10 MG tablet  ?  Sig: Take 1 tablet (10 mg total) by mouth daily. Take 1/2 tablet (5mg ) daily for 8 days then increase to 1 full tablet (10mg ) daily.  ?  Dispense:  90 tablet  ?  Refill:  0  ?  Order Specific Question:   Supervising Provider  ?  Answer:   MATTHEWS, CODY [[id number]]  ? ? ?There are no Patient Instructions on file for this visit. ? ?Follow-up plan: Return in about 6 weeks (around 08/07/2021) for mood follow up. ? ? , DNP, APRN, FNP-BC ?[facility] 08/09/2021 ?Primary Care and Sports Medicine ?

## 2021-06-27 LAB — COMPLETE METABOLIC PANEL WITH GFR
AG Ratio: 1.7 (calc) (ref 1.0–2.5)
ALT: 29 U/L (ref 9–46)
AST: 19 U/L (ref 10–40)
Albumin: 4.7 g/dL (ref 3.6–5.1)
Alkaline phosphatase (APISO): 81 U/L (ref 36–130)
BUN: 11 mg/dL (ref 7–25)
CO2: 26 mmol/L (ref 20–32)
Calcium: 9.9 mg/dL (ref 8.6–10.3)
Chloride: 103 mmol/L (ref 98–110)
Creat: 0.92 mg/dL (ref 0.60–1.24)
Globulin: 2.7 g/dL (calc) (ref 1.9–3.7)
Glucose, Bld: 87 mg/dL (ref 65–99)
Potassium: 4.8 mmol/L (ref 3.5–5.3)
Sodium: 139 mmol/L (ref 135–146)
Total Bilirubin: 0.7 mg/dL (ref 0.2–1.2)
Total Protein: 7.4 g/dL (ref 6.1–8.1)
eGFR: 115 mL/min/{1.73_m2} (ref >=60)

## 2021-06-27 LAB — CBC WITH DIFFERENTIAL/PLATELET
Absolute Monocytes: 447 cells/uL (ref 200–950)
Basophils Absolute: 41 cells/uL (ref 0–200)
Basophils Relative: 0.7 %
Eosinophils Absolute: 58 cells/uL (ref 15–500)
Eosinophils Relative: 1 %
HCT: 53.9 % — ABNORMAL HIGH (ref 38.5–50.0)
Hemoglobin: 18 g/dL — ABNORMAL HIGH (ref 13.2–17.1)
Lymphs Abs: 1572 cells/uL (ref 850–3900)
MCH: 29.6 pg (ref 27.0–33.0)
MCHC: 33.4 g/dL (ref 32.0–36.0)
MCV: 88.5 fL (ref 80.0–100.0)
MPV: 11.8 fL (ref 7.5–12.5)
Monocytes Relative: 7.7 %
Neutro Abs: 3683 cells/uL (ref 1500–7800)
Neutrophils Relative %: 63.5 %
Platelets: 359 10*3/uL (ref 140–400)
RBC: 6.09 10*6/uL — ABNORMAL HIGH (ref 4.20–5.80)
RDW: 13.5 % (ref 11.0–15.0)
Total Lymphocyte: 27.1 %
WBC: 5.8 10*3/uL (ref 3.8–10.8)

## 2021-06-27 LAB — LIPID PANEL
Cholesterol: 147 mg/dL (ref <200)
HDL: 41 mg/dL (ref >=40)
LDL Cholesterol (Calc): 76 mg/dL (calc)
Non-HDL Cholesterol (Calc): 106 mg/dL (calc) (ref <130)
Total CHOL/HDL Ratio: 3.6 (calc) (ref <5.0)
Triglycerides: 199 mg/dL — ABNORMAL HIGH (ref <150)

## 2021-06-27 LAB — HEPATITIS C ANTIBODY
Hepatitis C Ab: NONREACTIVE
SIGNAL TO CUT-OFF: <0.02 (ref <1.00)

## 2021-06-28 NOTE — Addendum Note (Signed)
Addended bySamuel Bouche on: 06/28/2021 07:48 AM ? ? Modules accepted: Orders ? ?

## 2021-07-06 ENCOUNTER — Ambulatory Visit (INDEPENDENT_AMBULATORY_CARE_PROVIDER_SITE_OTHER): Payer: 59 | Admitting: Podiatry

## 2021-07-06 ENCOUNTER — Encounter: Payer: Self-pay | Admitting: Podiatry

## 2021-07-06 DIAGNOSIS — L600 Ingrowing nail: Secondary | ICD-10-CM | POA: Diagnosis not present

## 2021-07-06 NOTE — Progress Notes (Signed)
?  Subjective:  ?Patient ID: Victor Sandoval, male    DOB: 10/17/1990,   MRN: [id number] ? ?Chief Complaint  ?Patient presents with  ? Nail Problem  ?  I have some ingrown toenails on both big toes and the 2nd on the left and there was some draining and does swell at times and I have not been soaking them  ? ? ?31 y.o. male presents for concern of bilateral great toes ingrown. Patient relates they are sore and burn. Relates they have been swollen but the pain has come and gone. He is not diabetic.  Marland Kitchen Denies any other pedal complaints. Denies n/v/f/c.  ? ?Past Medical History:  ?Diagnosis Date  ? Depression   ? History of palpitations   ? ? ?Objective:  ?Physical Exam: ?Vascular: DP/PT pulses 2/4 bilateral. CFT <3 seconds. Normal hair growth on digits. No edema.  ?Skin. No lacerations or abrasions bilateral feet. Incurvation of bilateral borders of bilateral hallux nails. No erythema edema or purulence noted.  ?Musculoskeletal: MMT 5/5 bilateral lower extremities in DF, PF, Inversion and Eversion. Deceased ROM in DF of ankle joint.  ?Neurological: Sensation intact to light touch.  ? ?Assessment:  ? ?1. Ingrown left greater toenail   ?2. Ingrown right greater toenail   ? ? ? ?Plan:  ?Patient was evaluated and treated and all questions answered. ?Patient requesting removal of ingrown nail today. Procedure below.  ?Discussed procedure and post procedure care and patient expressed understanding.  ?Will follow-up in 2 weeks for nail check or sooner if any problems arise.  ? ? ?Procedure:  ?Procedure: partial Nail Avulsion of bilaterally hallux bilateral nail border.  ?Surgeon: Lorenda Peck, DPM  ?Pre-op Dx: Ingrown toenail without infection ?Post-op: Same  ?Place of Surgery: Office exam room.  ?Indications for surgery: Painful and ingrown toenail.  ? ? ?The patient is requesting removal of nail with chemical matrixectomy. Risks and complications were discussed with the patient for which they understand and written consent  was obtained. Under sterile conditions a total of 3 mL of  1% lidocaine plain was infiltrated in a hallux block fashion. Once anesthetized, the skin was prepped in sterile fashion. A tourniquet was then applied. Next the bilateral aspect of hallux nail border was then sharply excised making sure to remove the entire offending nail border.  Next phenol was then applied under standard conditions and copiously irrigated. Silvadene was applied. A dry sterile dressing was applied. After application of the dressing the tourniquet was removed and there is found to be an immediate capillary refill time to the digit. The patient tolerated the procedure well without any complications. Post procedure instructions were discussed the patient for which he verbally understood. Follow-up in two weeks for nail check or sooner if any problems are to arise. Discussed signs/symptoms of infection and directed to call the office immediately should any occur or go directly to the emergency room. In the meantime, encouraged to call the office with any questions, concerns, changes symptoms. ? ? ?Lorenda Peck, DPM  ? ? ?

## 2021-07-06 NOTE — Patient Instructions (Signed)

## 2021-07-20 ENCOUNTER — Ambulatory Visit (INDEPENDENT_AMBULATORY_CARE_PROVIDER_SITE_OTHER): Payer: 59 | Admitting: Podiatry

## 2021-07-20 DIAGNOSIS — Z91199 Patient's noncompliance with other medical treatment and regimen due to unspecified reason: Secondary | ICD-10-CM

## 2021-07-21 ENCOUNTER — Other Ambulatory Visit: Payer: Self-pay | Admitting: Medical-Surgical

## 2021-07-21 NOTE — Progress Notes (Signed)
No show

## 2021-08-08 ENCOUNTER — Ambulatory Visit (INDEPENDENT_AMBULATORY_CARE_PROVIDER_SITE_OTHER): Payer: 59 | Admitting: Medical-Surgical

## 2021-08-08 ENCOUNTER — Encounter: Payer: Self-pay | Admitting: Medical-Surgical

## 2021-08-08 VITALS — BP 138/67 | HR 99 | Resp 20 | Ht 72.0 in | Wt 236.4 lb

## 2021-08-08 DIAGNOSIS — F418 Other specified anxiety disorders: Secondary | ICD-10-CM | POA: Diagnosis not present

## 2021-08-08 NOTE — Progress Notes (Signed)
?  HPI with pertinent ROS:  ? ?CC: Mood follow-up ? ?HPI: ?Pleasant 31 year old male presenting today for follow-up on mood.  He was started on Lexapro approximately 6 weeks ago.  He originally took 5 mg daily for 1 week then increased his dose to 10 mg daily and has tolerated well, without side effects. Sleeping well. Notes restless leg issues have improved. Did not get in touch with counseling and is not sure he would like to proceed with this.  Denies SI/HI. ? ?I reviewed the past medical history, family history, social history, surgical history, and allergies today and no changes were needed.  Please see the problem list section below in epic for further details. ? ? ?  08/08/2021  ?  2:57 PM 06/26/2021  ?  2:34 PM 06/23/2020  ?  1:39 PM 06/08/2019  ?  1:31 PM 06/06/2018  ? 11:55 AM  ?Depression screen PHQ 2/9  ?Decreased Interest 1 3 3 1 1   ?Down, Depressed, Hopeless 0 1 2 2 1   ?PHQ - 2 Score 1 4 5 3 2   ?Altered sleeping 1 3  3 3   ?Tired, decreased energy 1 2  3 3   ?Change in appetite 0 3  3 1   ?Feeling bad or failure about yourself  0 2  2 1   ?Trouble concentrating 0 1  0 2  ?Moving slowly or fidgety/restless 0 3  0 1  ?Suicidal thoughts 0 1  0 1  ?PHQ-9 Score 3 19  14 14   ?Difficult doing work/chores Not difficult at all Very difficult  Not difficult at all Not difficult at all  ? ? ?  08/08/2021  ?  2:56 PM 06/26/2021  ?  2:34 PM 06/08/2019  ?  1:31 PM 06/06/2018  ? 11:55 AM  ?GAD 7 : Generalized Anxiety Score  ?Nervous, Anxious, on Edge 1 3 2 3   ?Control/stop worrying 1 3 1 3   ?Worry too much - different things 0 3 0 3  ?Trouble relaxing 0 3 2 2   ?Restless 0 3 1 2   ?Easily annoyed or irritable 1 0 0 1  ?Afraid - awful might happen 0 0 0 0  ?Total GAD 7 Score 3 15 6 14   ?Anxiety Difficulty Not difficult at all Somewhat difficult Not difficult at all Somewhat difficult  ? ?Physical exam:  ? ?General: Well Developed, well nourished, and in no acute distress.  ?Neuro: Alert and oriented x3.  ?HEENT: Normocephalic,  atraumatic.  ?Skin: Warm and dry. ?Cardiac: Regular rate and rhythm, no murmurs rubs or gallops, no lower extremity edema.  ?Respiratory: Clear to auscultation bilaterally. Not using accessory muscles, speaking in full sentences. ? ?Impression and Recommendations:   ? ?1. Anxiety with depression ?Great improvement in PHQ-9 and GAD-7 scores.  Symptoms now well controlled and stable.  Continue Lexapro 10 mg daily as prescribed. ? ?Return in about 6 months (around 02/08/2022) for mood follow up. ?___________________________________________ ?Clearnce Sorrel, DNP, APRN, FNP-BC ?Primary Care and Sports Medicine ?[facility] ?

## 2021-08-08 NOTE — Patient Instructions (Signed)
Behavioral health referral for counseling has been resent to the location below: ? ?[facility] in [place] [phone] ?

## 2021-08-09 LAB — CBC WITH DIFFERENTIAL/PLATELET
Absolute Monocytes: 600 cells/uL (ref 200–950)
Basophils Absolute: 38 cells/uL (ref 0–200)
Basophils Relative: 0.5 %
Eosinophils Absolute: 90 cells/uL (ref 15–500)
Eosinophils Relative: 1.2 %
HCT: 50.9 % — ABNORMAL HIGH (ref 38.5–50.0)
Hemoglobin: 16.8 g/dL (ref 13.2–17.1)
Lymphs Abs: 2130 cells/uL (ref 850–3900)
MCH: 29.4 pg (ref 27.0–33.0)
MCHC: 33 g/dL (ref 32.0–36.0)
MCV: 89.1 fL (ref 80.0–100.0)
MPV: 11 fL (ref 7.5–12.5)
Monocytes Relative: 8 %
Neutro Abs: 4643 cells/uL (ref 1500–7800)
Neutrophils Relative %: 61.9 %
Platelets: 370 10*3/uL (ref 140–400)
RBC: 5.71 10*6/uL (ref 4.20–5.80)
RDW: 13.7 % (ref 11.0–15.0)
Total Lymphocyte: 28.4 %
WBC: 7.5 10*3/uL (ref 3.8–10.8)

## 2021-08-09 LAB — PATHOLOGIST SMEAR REVIEW

## 2021-12-26 IMAGING — DX DG CHEST 2V
2 series · 2 of 2 positions shown · non-contrast
Comparison: None.

CLINICAL DATA: Upper sternum chest pain and tenderness

EXAM:
CHEST - 2 VIEW

[chest pa]
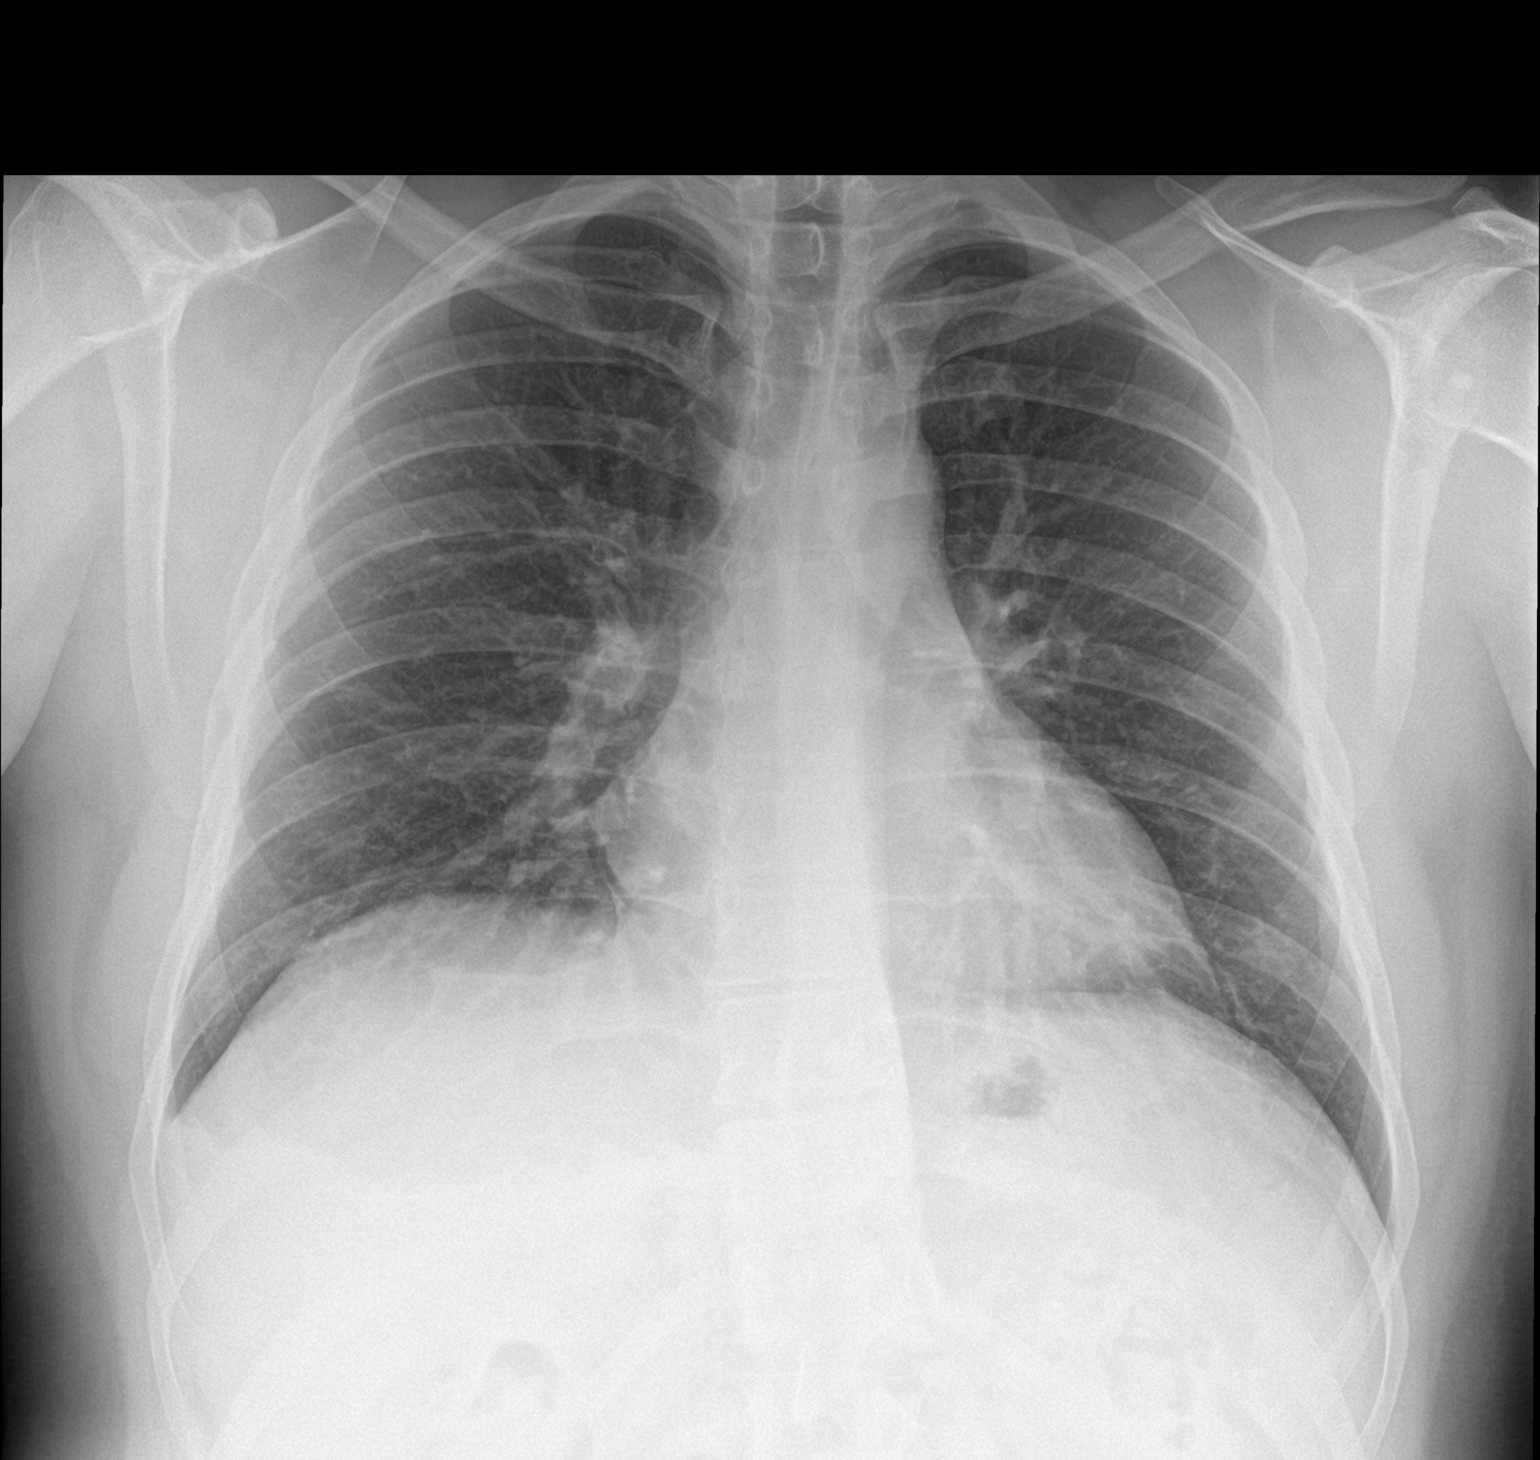

[chest lat]
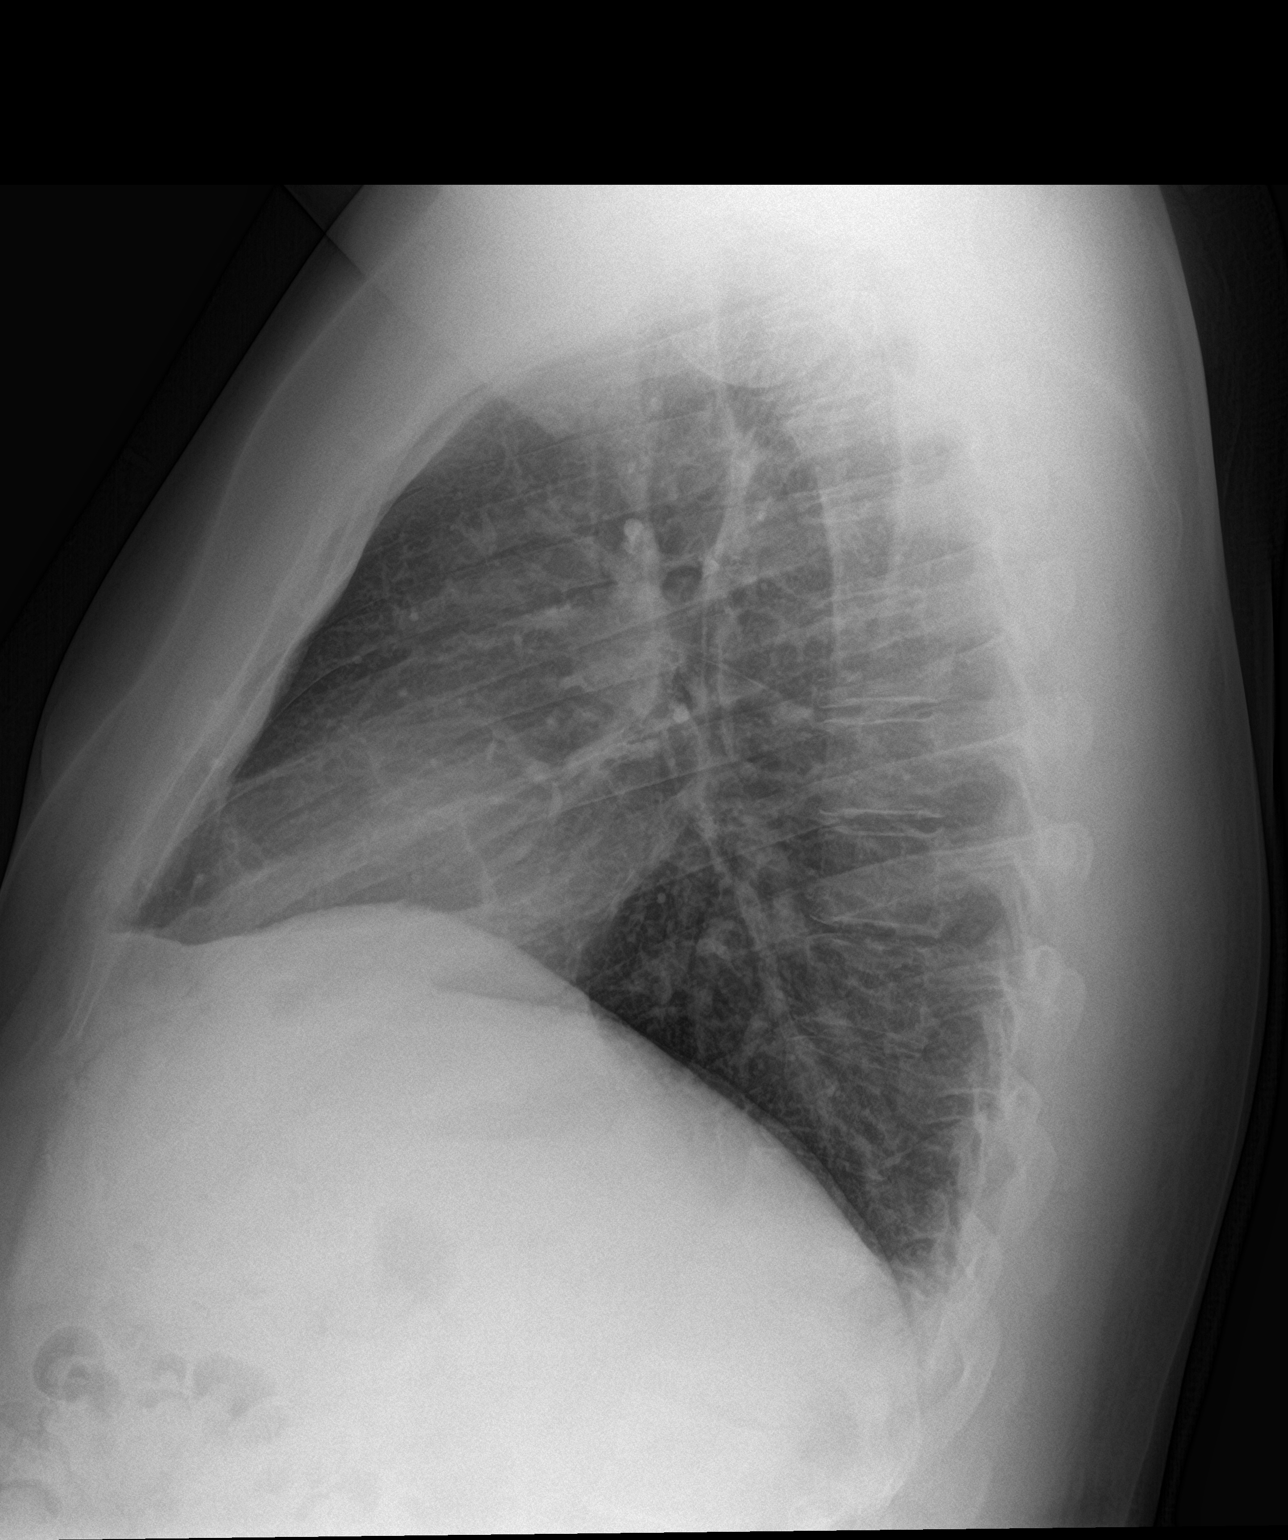

[2 of 2 positions shown; findings below may reference images not displayed]

FINDINGS: The heart size and mediastinal contours are within normal limits.
Both lungs are clear. No pleural effusion or pneumothorax. The
visualized skeletal structures are unremarkable.
IMPRESSION: No acute process the chest.

## 2022-01-05 IMAGING — DX DG CERVICAL SPINE COMPLETE 4+V
6 series · 6 of 6 positions shown · non-contrast
Comparison: None.

CLINICAL DATA: Left shoulder pain 2 weeks

EXAM:
CERVICAL SPINE - COMPLETE 4+ VIEW

[c-spine lat]
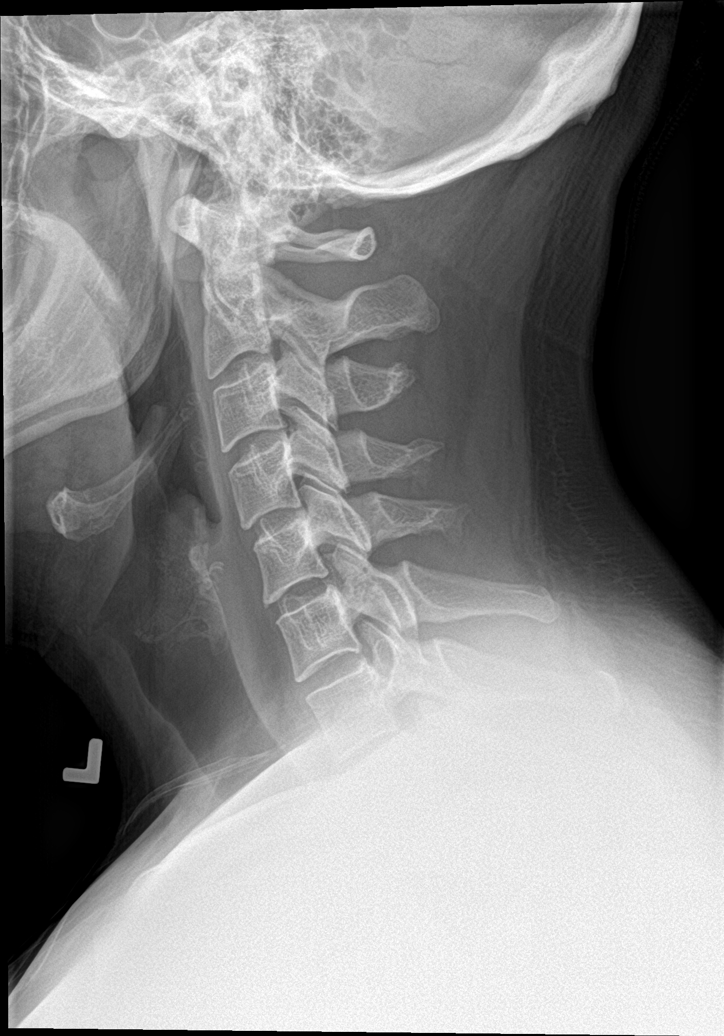

[c-spine obl (1 of 2)]
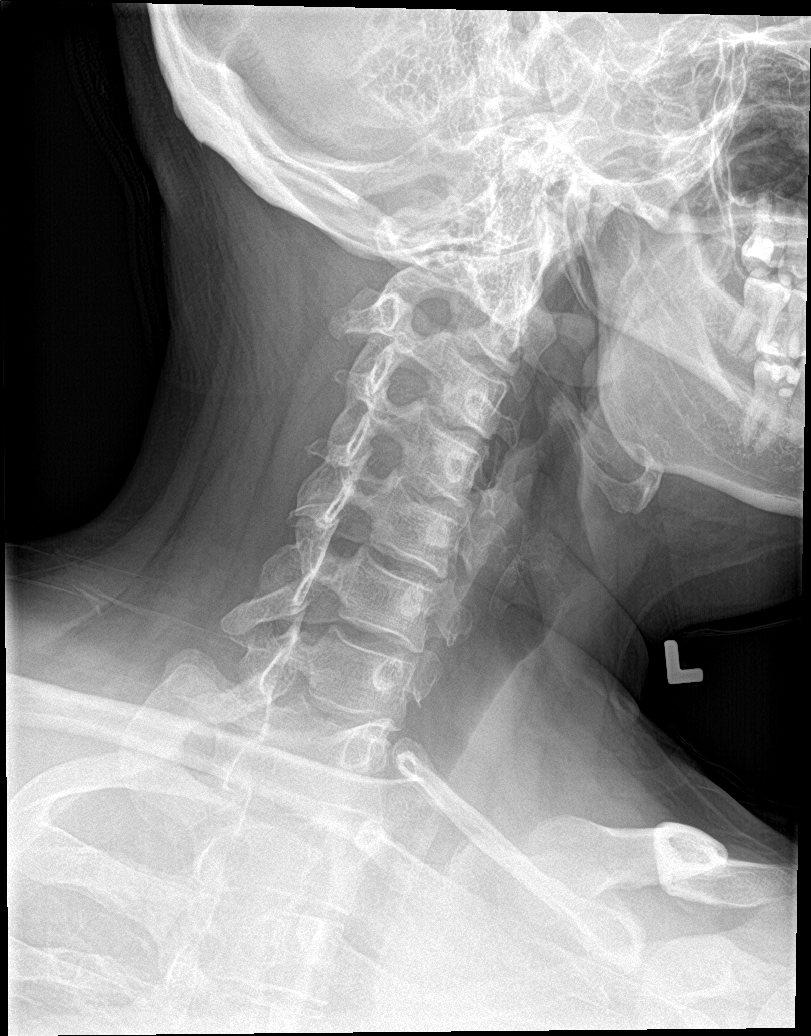

[c-spine obl (2 of 2)]
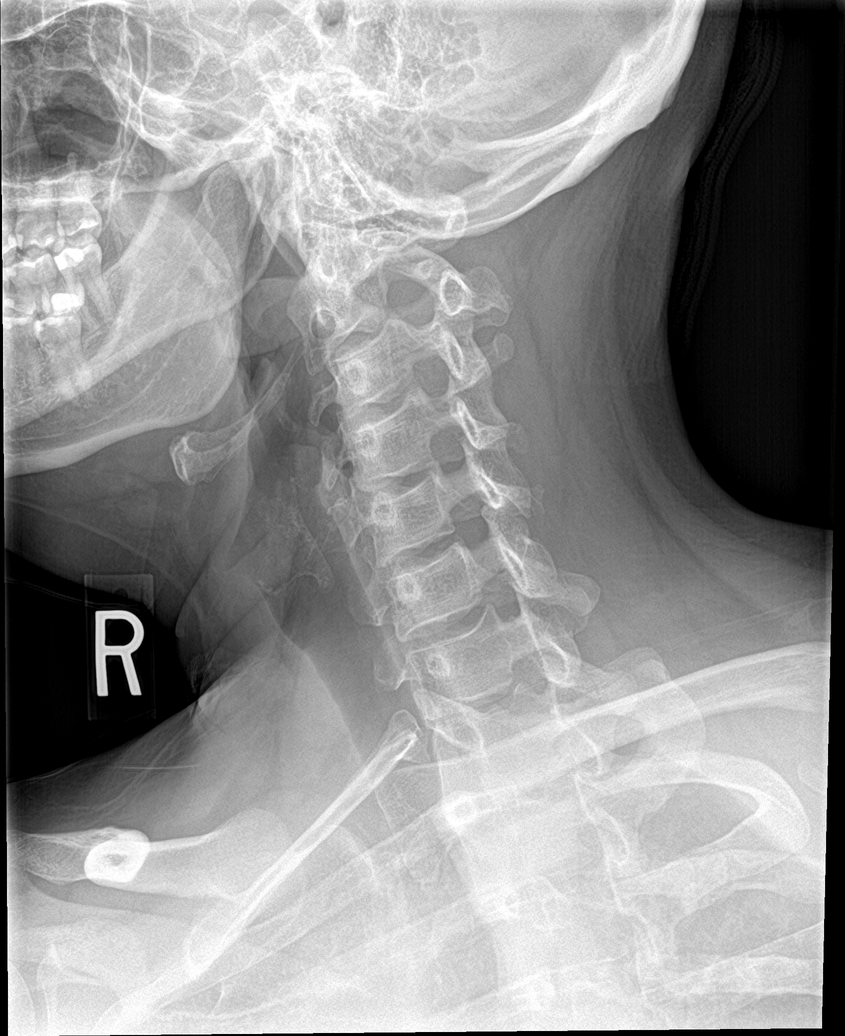

[c-spine ap]
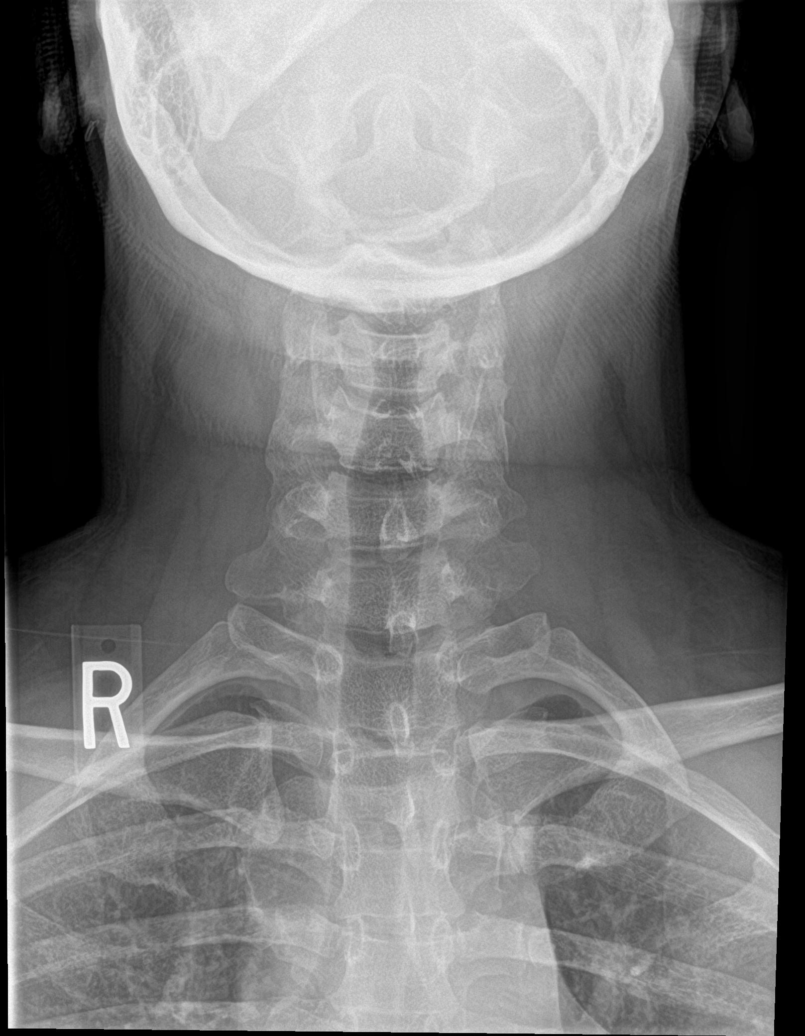

[c-spine open mouth]
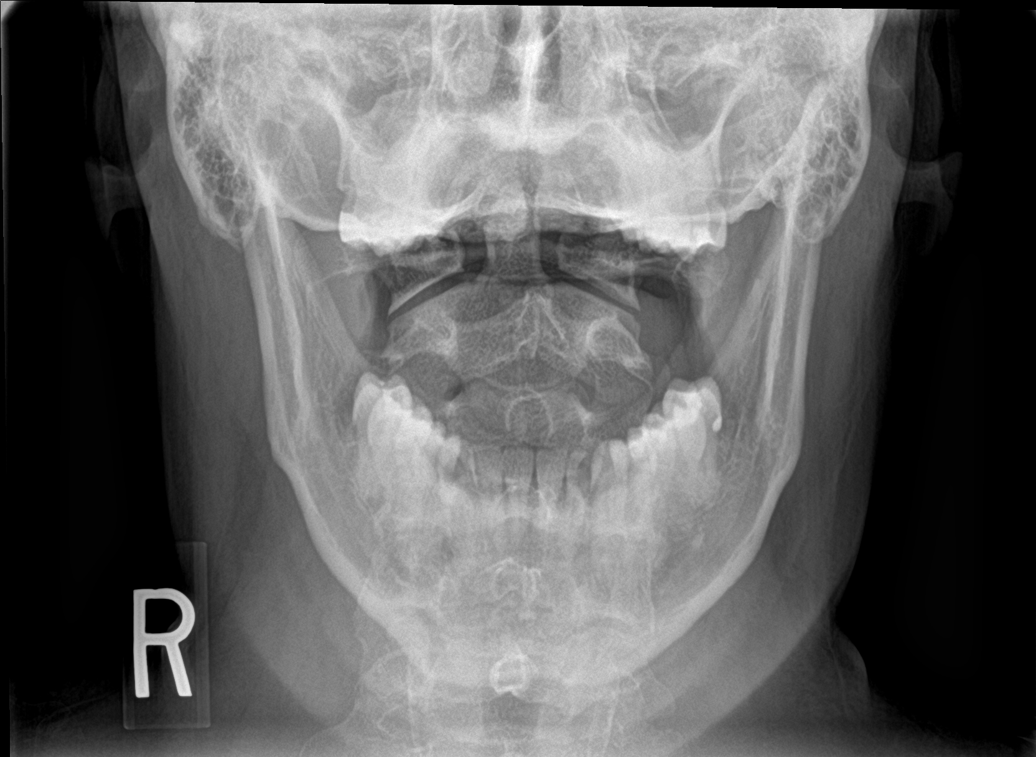

[c-spine swimmers]
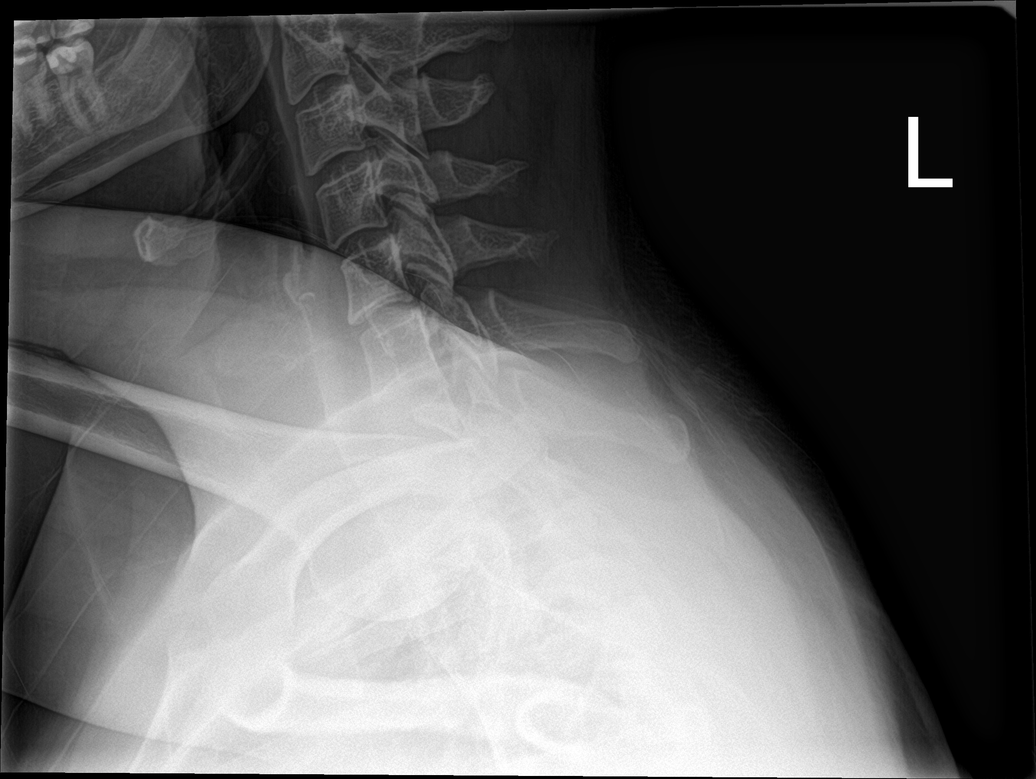

[6 of 6 positions shown; findings below may reference images not displayed]

FINDINGS: There is no evidence of cervical spine fracture or prevertebral soft
tissue swelling. Alignment is normal. Intervertebral disc spaces are
preserved. Unremarkable facet joints. Oblique views demonstrate
widely patent bony foramina bilaterally. No other significant bone
abnormalities are identified.
IMPRESSION: Negative cervical spine radiographs.

## 2022-01-07 IMAGING — CT CT ANGIO CHEST
3 of 9 series · 18 of 36 positions shown · IV contrast (omnipaque)
Comparison: Chest radiograph 07/29/2020

CLINICAL DATA: Chest pain on the left, elevated D-dimer level.

EXAM:
CT ANGIOGRAPHY CHEST WITH CONTRAST
TECHNIQUE: Multidetector CT imaging of the chest was performed using the
standard protocol during bolus administration of intravenous
contrast. Multiplanar CT image reconstructions and MIPs were
obtained to evaluate the vascular anatomy.
CONTRAST:  100mL OMNIPAQUE IOHEXOL 350 MG/ML SOLN

[Series 5: pe lung · axial · 0.70mm/px · z∈[-276,-90]mm · 4 of 155 slices shown]
[im 31/155  mediastinal]
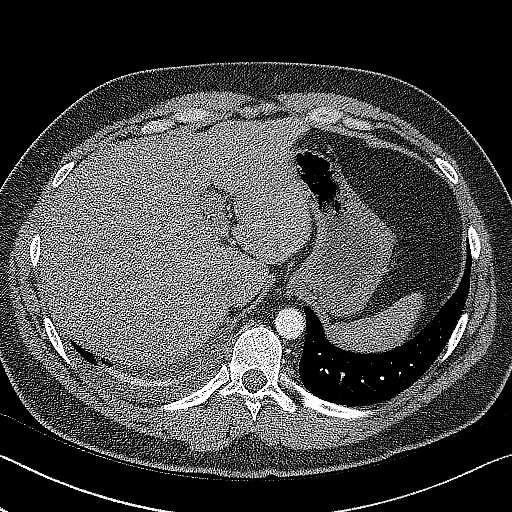
[im 62/155  mediastinal]
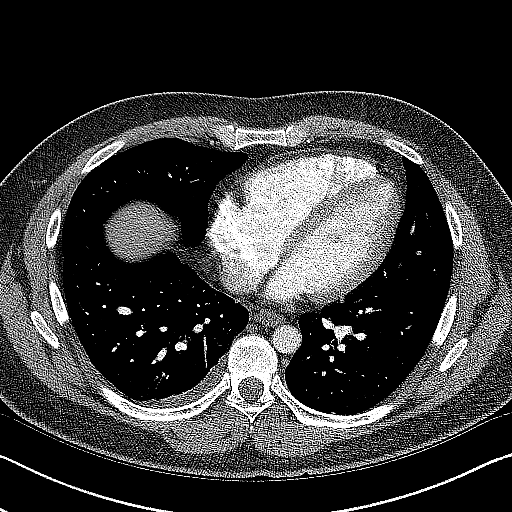
[im 93/155  mediastinal]
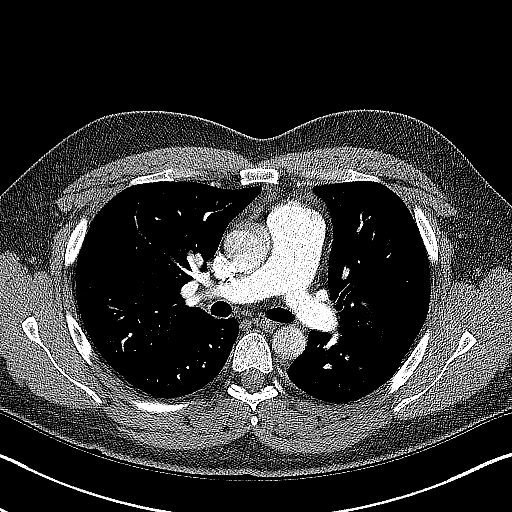
[im 124/155  mediastinal]
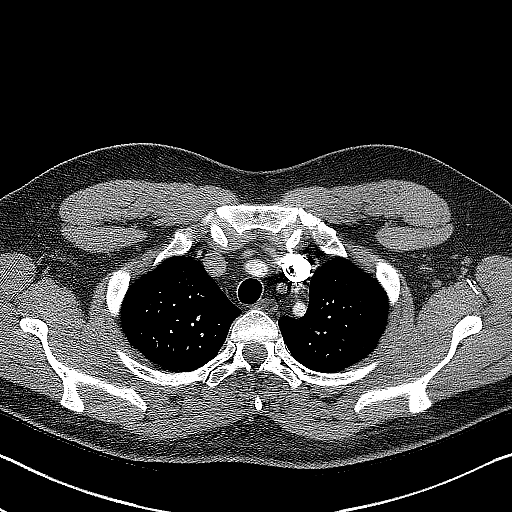

[Series 6: pe coronal mpr · coronal · 0.61mm/px · 1 of 126 slices shown]
[im 63/126  mediastinal]
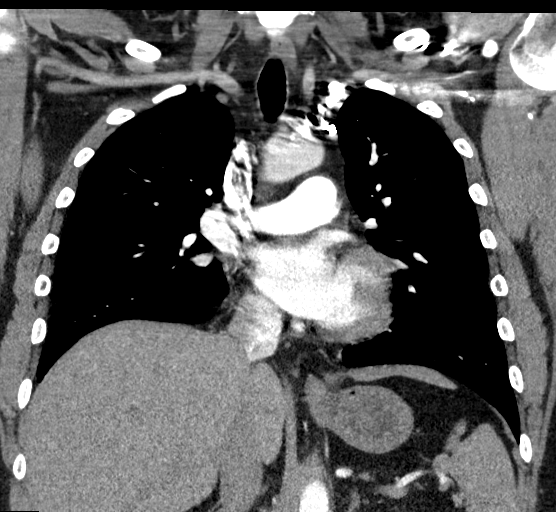

[Series 10: pe thins · axial · 0.70mm/px · z∈[-314,-50]mm · 13 of 386 slices shown]
[im 28/386  lung]
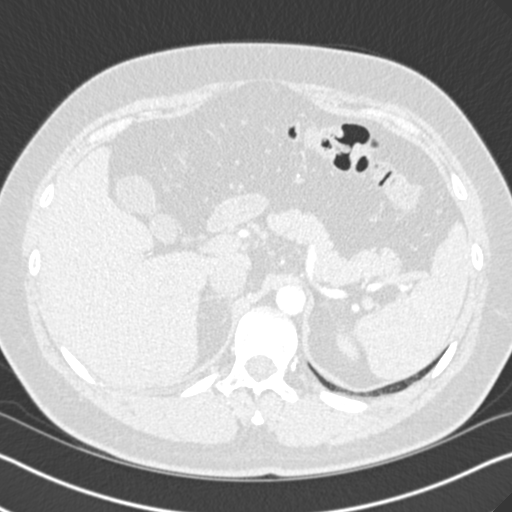
[im 56/386  mediastinal]
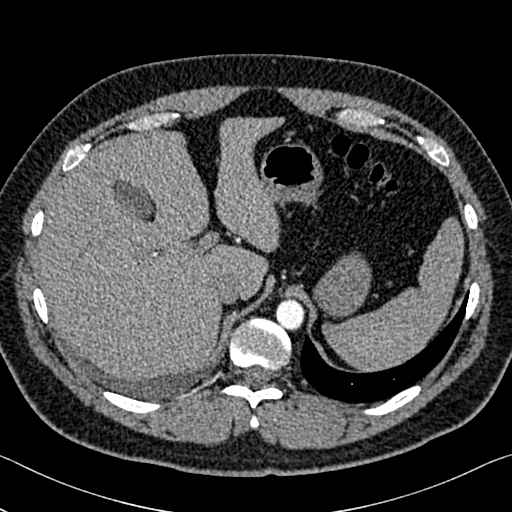
[im 83/386  lung]
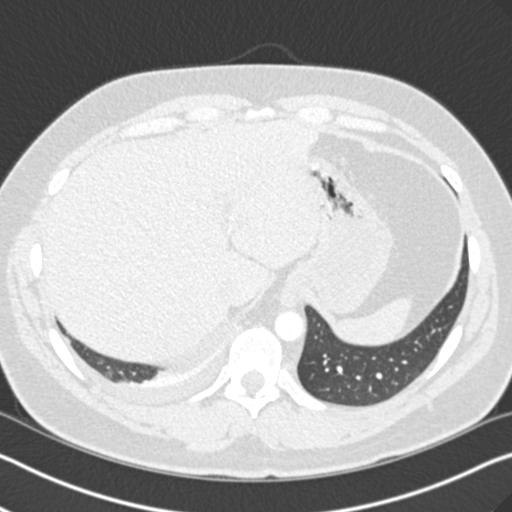
[im 111/386  mediastinal]
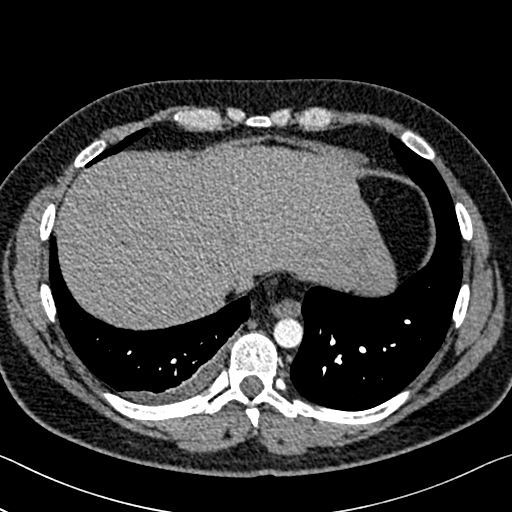
[im 138/386  lung]
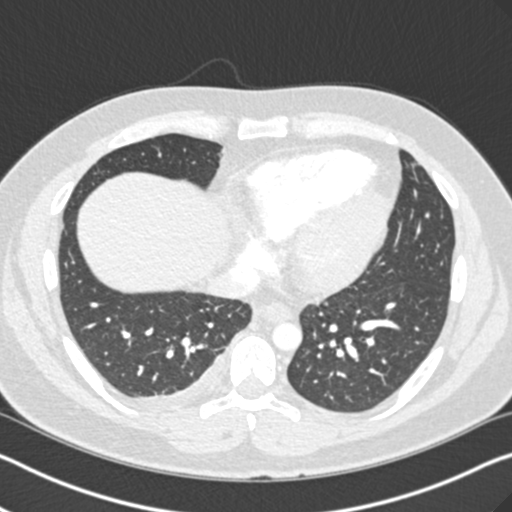
[im 166/386  mediastinal]
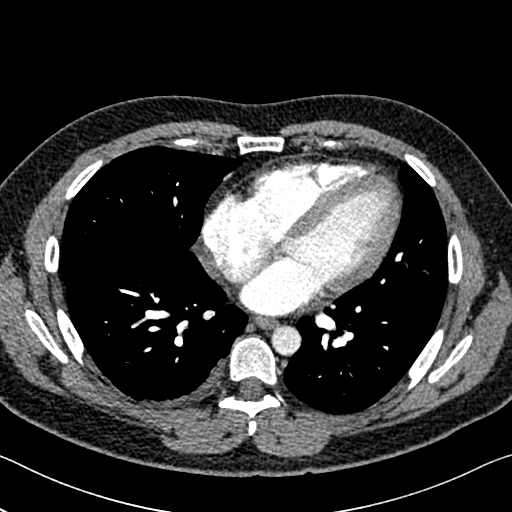
[im 193/386  lung]
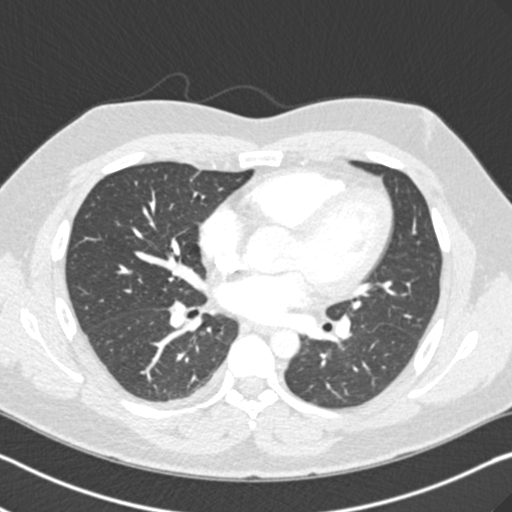
[im 221/386  mediastinal]
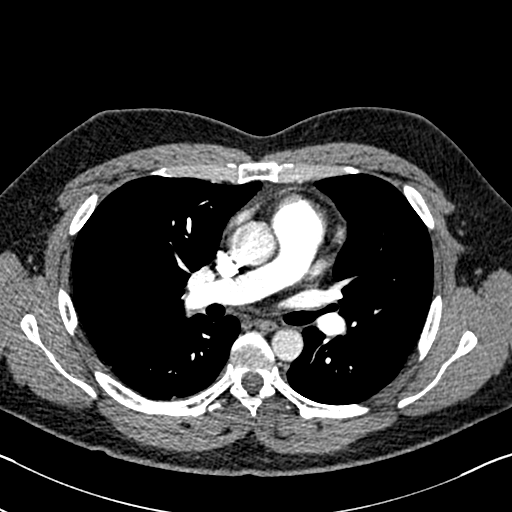
[im 248/386  lung]
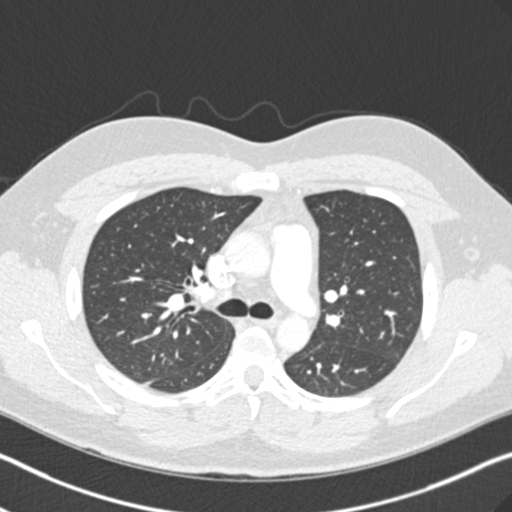
[im 276/386  mediastinal]
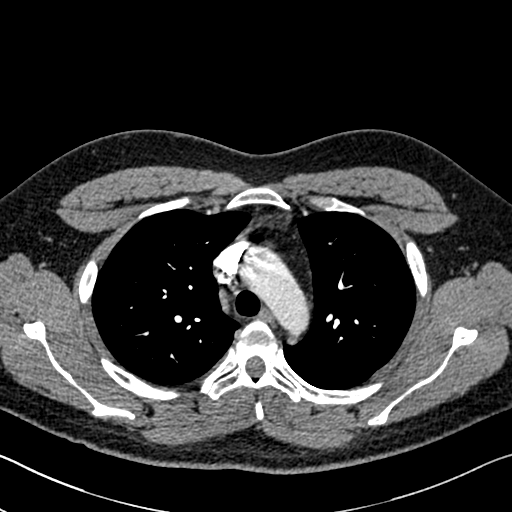
[im 303/386  lung]
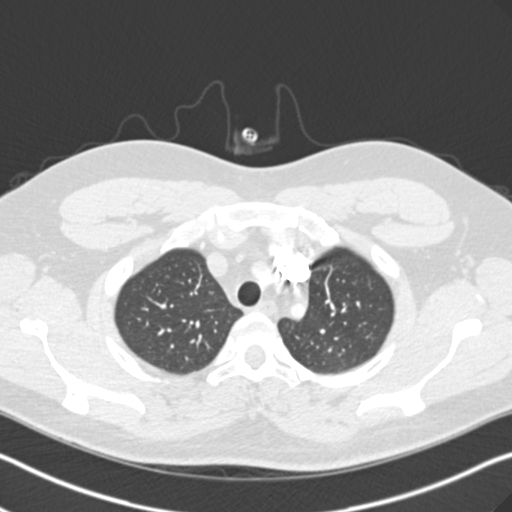
[im 331/386  mediastinal]
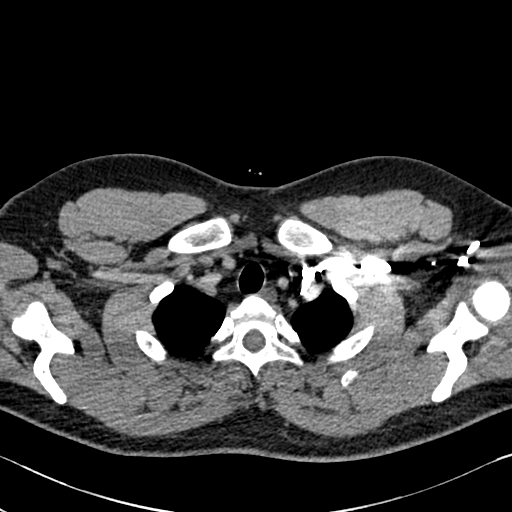
[im 358/386  lung]
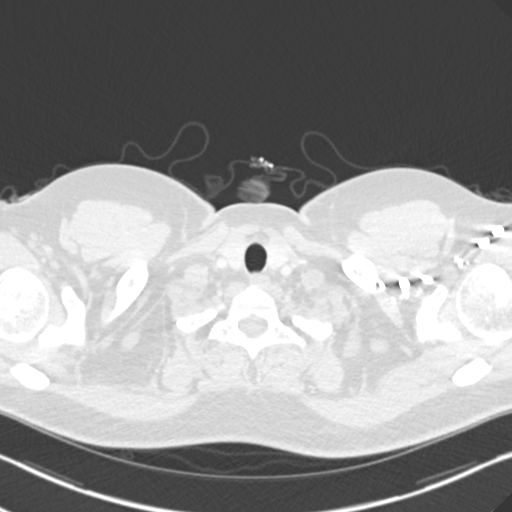

[18 of 36 positions shown; findings below may reference images not displayed]

FINDINGS: Cardiovascular: No filling defect is identified in the pulmonary
arterial tree to suggest pulmonary embolus. Although contrast timing
was optimized for the pulmonary arteries, no acute aortic
abnormality is identified. Borderline enlargement of the
cardiopericardial silhouette noted. Mild prominence of the main
pulmonary artery at 4.0 cm transverse.

Mediastinum/Nodes: Anterior mediastinal hazy density is not
appreciably masslike and probably represents residual thymic tissue.
No pathologic adenopathy.

Lungs/Pleura: Small right pleural effusion with passive atelectasis.
Linear subsegmental atelectasis or scarring medially in the right
middle lobe.

Upper Abdomen: Unremarkable

Musculoskeletal: Unremarkable

Review of the MIP images confirms the above findings.
IMPRESSION: 1. No filling defect is identified in the pulmonary arterial tree to
suggest pulmonary embolus.
2. Small right pleural effusion with passive atelectasis.
3. Borderline cardiomegaly with mildly enlarged proximal pulmonary
artery which could indicate pulmonary arterial hypertension.

## 2022-01-20 ENCOUNTER — Encounter: Payer: Self-pay | Admitting: Emergency Medicine

## 2022-01-20 ENCOUNTER — Ambulatory Visit (INDEPENDENT_AMBULATORY_CARE_PROVIDER_SITE_OTHER): Payer: 59

## 2022-01-20 ENCOUNTER — Ambulatory Visit
Admission: EM | Admit: 2022-01-20 | Discharge: 2022-01-20 | Disposition: A | Discharge status: Home / Self Care | Payer: 59 | Attending: Family Medicine | Admitting: Family Medicine

## 2022-01-20 DIAGNOSIS — M79671 Pain in right foot: Secondary | ICD-10-CM | POA: Diagnosis not present

## 2022-01-20 DIAGNOSIS — M25572 Pain in left ankle and joints of left foot: Secondary | ICD-10-CM | POA: Diagnosis not present

## 2022-01-20 DIAGNOSIS — W19XXXA Unspecified fall, initial encounter: Secondary | ICD-10-CM | POA: Diagnosis not present

## 2022-01-20 DIAGNOSIS — M79675 Pain in left toe(s): Secondary | ICD-10-CM

## 2022-01-20 DIAGNOSIS — S82839A Other fracture of upper and lower end of unspecified fibula, initial encounter for closed fracture: Secondary | ICD-10-CM

## 2022-01-20 NOTE — ED Notes (Signed)
Pt educated on the importance of taking lexapro daily  & not as needed in order to maintain therapeutic levels in in his body. Pt verbalized an understanding.

## 2022-01-20 NOTE — Discharge Instructions (Signed)
Limit walking Use ice to reduce pain and swelling Take Tylenol or ibuprofen for pain Wear boot at all times you are upright Call your primary care office on Monday to set up an appointment with Dr. Darene Lamer for follow-up

## 2022-01-20 NOTE — ED Notes (Signed)
Pt educated on the importance of taking  lexapro daily & not as needed in order to obtain therapeutic levels in his body. Pt verbalized an understanding.

## 2022-01-20 NOTE — ED Triage Notes (Signed)
Twisted left ankle in a parking lot at [address] yesterday, also has pain to r great toe [place] [address] into a bush - pt stated he had an abrasion to the  right ear after from hitting the bush  No LOC  Denies  dizziness No OTC  meds  Here w/ his  girlfriend

## 2022-01-20 NOTE — ED Provider Notes (Signed)
[facility]    CSN: [id number] Arrival date & time: 01/20/22  1244      History   Chief Complaint Chief Complaint  Patient presents with   Ankle Pain    Left     HPI Victor Sandoval is a 31 y.o. male.   HPI  Patient fell yesterday.  Injured both legs.  His left ankle is hurting around the outside portion.  His right big toe is hurting he also has some soreness in his right outer leg.  Pain with weightbearing.  Some scratches from [place].  Last tetanus 2022  Past Medical History:  Diagnosis Date   Depression    History of palpitations    Spontaneous pneumothorax 2021    Patient Active Problem List   Diagnosis Date Noted   Anxiety with depression 08/08/2021   PVC (premature ventricular contraction) 08/09/2020    Past Surgical History:  Procedure Laterality Date   KNEE SURGERY     MOUTH SURGERY     wisdom tooth and tongue tied   REFRACTIVE SURGERY         Home Medications    Prior to Admission medications   Medication Sig Start Date End Date Taking? Authorizing Provider  escitalopram (LEXAPRO) 10 MG tablet Take 1 tablet (10 mg total) by mouth daily. Take 1/2 tablet (5mg ) daily for 8 days then increase to 1 full tablet (10mg ) daily. 06/26/21   Samuel Bouche, NP  Multiple Vitamin (MULTIVITAMIN) tablet Take 1 tablet by mouth daily. Patient not taking: Reported on 01/20/2022    [provider]    Family History Family History  Problem Relation Age of Onset   High blood pressure Maternal Grandmother    High blood pressure Maternal Grandfather    Skin cancer Maternal Grandfather    Clotting disorder Maternal Uncle    Healthy Mother    Healthy Father    Anxiety disorder Sister    ADD / ADHD Sister    Depression Sister    Colon cancer Neg Hx    Esophageal cancer Neg Hx    Stomach cancer Neg Hx    Rectal cancer Neg Hx     Social History Social History   Tobacco Use   Smoking status: Never   Smokeless tobacco: Never  Vaping Use    Vaping Use: Never used  Substance Use Topics   Alcohol use: Yes    Alcohol/week: 8.0 standard drinks of alcohol    Types: 8 Standard drinks or equivalent per week   Drug use: Never     Allergies   Patient has no known allergies.   Review of Systems Review of Systems  See HPI Physical Exam Triage Vital Signs ED Triage Vitals  Enc Vitals Group     BP 01/20/22 1256 124/86     Pulse Rate 01/20/22 1256 91     Resp 01/20/22 1256 14     Temp 01/20/22 1256 99 F (37.2 C)     Temp Source 01/20/22 1256 Oral     SpO2 01/20/22 1256 97 %     Weight 01/20/22 1258 240 lb (108.9 kg)     Height 01/20/22 1258 6' (1.829 m)     Head Circumference --      Peak Flow --      Pain Score 01/20/22 1257 6     Pain Loc --      Pain Edu? --      Excl. in [place]? --    No data found.  Updated Vital Signs BP 124/86 (BP Location: Left Arm)   Pulse 91   Temp 99 F (37.2 C) (Oral)   Resp 14   Ht 6' (1.829 m)   Wt 108.9 kg   SpO2 97%   BMI 32.55 kg/m     Physical Exam Constitutional:      General: He is not in acute distress.    Appearance: He is well-developed.  HENT:     Head: Normocephalic and atraumatic.  Eyes:     Conjunctiva/sclera: Conjunctivae normal.     Pupils: Pupils are equal, round, and reactive to light.  Cardiovascular:     Rate and Rhythm: Normal rate.  Pulmonary:     Effort: Pulmonary effort is normal. No respiratory distress.  Abdominal:     General: There is no distension.     Palpations: Abdomen is soft.  Musculoskeletal:        General: Swelling, tenderness and signs of injury present. No deformity. Normal range of motion.     Cervical back: Normal range of motion.     Comments: Left ankle tenderness and swelling on the lateral malleolus.  Tenderness of ATFL.  Good range of motion with no instability.  Right foot and ankle examined.  Right great toe is tender at the DIP joint.  Pain with movement of joint.  No swelling or discoloration.  There is also mild  tenderness to palpation of the right fibula up towards the knee.  Patient can fully weight-bear on the right leg without limp.  No tenderness in ankle.  Skin:    General: Skin is warm and dry.  Neurological:     Mental Status: He is alert.     Gait: Gait abnormal.  Psychiatric:        Mood and Affect: Mood normal.        Behavior: Behavior normal.      UC Treatments / Results  Labs (all labs ordered are listed, but only abnormal results are displayed) Labs Reviewed - No data to display  EKG   Radiology DG Toe Great Right  Result Date: 01/20/2022 CLINICAL DATA:  Pain after fall EXAM: RIGHT GREAT TOE COMPARISON:  None Available. FINDINGS: Soft tissue swelling in the first toe. No fractures are identified. No dislocations. IMPRESSION: Soft tissue swelling. No fracture identified. Electronically Signed   By: Dorise Bullion III M.D.   On: 01/20/2022 13:40   DG Ankle Complete Left  Result Date: 01/20/2022 CLINICAL DATA:  Status post fall.  Possible left ankle sprain. EXAM: LEFT ANKLE COMPLETE - 3+ VIEW COMPARISON:  None Available. FINDINGS: There is lateral soft tissue swelling overlying the scratch set soft tissue swelling is noted overlying the lateral malleolus. There are several thin curvilinear os ossific densities identified adjacent to the lateral malleolus which are concerning for avulsion injury. No signs of dislocation. IMPRESSION: Lateral soft tissue swelling with thin curvilinear ossific densities adjacent to the lateral malleolus concerning for avulsion injury. Electronically Signed   By: Kerby Moors M.D.   On: 01/20/2022 13:39    Procedures Procedures (including critical care time)  Medications Ordered in UC Medications - No data to display  Initial Impression / Assessment and Plan / UC Course  I have reviewed the triage vital signs and the nursing notes.  Pertinent labs & imaging results that were available during my care of the patient were reviewed by me and  considered in my medical decision making (see chart for details).     Described avulsion fracture  of distal fibula.  Showed patient the picture.  I told him it is basically a bad sprain but does need care.  Recommend follow-up with sports medicine Final Clinical Impressions(s) / UC Diagnoses   Final diagnoses:  Avulsion fracture of distal end of fibula  Fall, initial encounter  Foot pain, right     Discharge Instructions      Limit walking Use ice to reduce pain and swelling Take Tylenol or ibuprofen for pain Wear boot at all times you are upright Call your primary care office on Monday to set up an appointment with Dr. Darene Lamer for follow-up     ED Prescriptions   None    PDMP not reviewed this encounter.   Raylene Everts, MD 01/20/22 [id number]

## 2022-01-25 ENCOUNTER — Ambulatory Visit (INDEPENDENT_AMBULATORY_CARE_PROVIDER_SITE_OTHER): Payer: 59 | Admitting: Sports Medicine

## 2022-01-25 ENCOUNTER — Encounter: Payer: Self-pay | Admitting: Sports Medicine

## 2022-01-25 VITALS — Wt 254.0 lb

## 2022-01-25 DIAGNOSIS — S92152A Displaced avulsion fracture (chip fracture) of left talus, initial encounter for closed fracture: Secondary | ICD-10-CM | POA: Insufficient documentation

## 2022-01-25 DIAGNOSIS — S92155A Nondisplaced avulsion fracture (chip fracture) of left talus, initial encounter for closed fracture: Secondary | ICD-10-CM

## 2022-01-25 MED ORDER — TRAMADOL HCL 50 MG PO TABS: 50.0000 mg | ORAL_TABLET | Freq: Three times a day (TID) | ORAL | 0 refills | Status: DC | PRN

## 2022-01-25 MED ORDER — ACETAMINOPHEN ER 650 MG PO TBCR: 650.0000 mg | EXTENDED_RELEASE_TABLET | Freq: Three times a day (TID) | ORAL | 3 refills | Status: DC | PRN

## 2022-01-25 NOTE — Assessment & Plan Note (Signed)
This is a very pleasant 31 year old male, 1 week ago he inverted his left ankle, immediate pain, swelling, bruising, seen in urgent care where x-rays showed a talar avulsion fracture, placed in a boot and referred to me, he is doing okay with Tylenol Extra Strength, he will increase to arthritis from Tylenol 3 times daily, adding tramadol for breakthrough pain, home physical therapy which she will start in about 2 weeks, he will continue for 4 weeks in the boot and then follow-up with me, if doing well and if not having tenderness over the fracture site we will transition him into an ASO.

## 2022-01-25 NOTE — Patient Instructions (Signed)
Start home physical therapy exercises in 2 weeks but continue boot when on your feet for 4 weeks.

## 2022-01-25 NOTE — Progress Notes (Signed)
    Procedures performed today:    None.  Independent interpretation of notes and tests performed by another provider:   X-rays personally viewed and show a minimal avulsion from the talus.  Brief History, Exam, Impression, and Recommendations:    Avulsion fracture of left talus This is a very pleasant 31 year old male, 1 week ago he inverted his left ankle, immediate pain, swelling, bruising, seen in urgent care where x-rays showed a talar avulsion fracture, placed in a boot and referred to me, he is doing okay with Tylenol Extra Strength, he will increase to arthritis from Tylenol 3 times daily, adding tramadol for breakthrough pain, home physical therapy which she will start in about 2 weeks, he will continue for 4 weeks in the boot and then follow-up with me, if doing well and if not having tenderness over the fracture site we will transition him into an ASO.    ____________________________________________ Gwen Her. Dianah Field, M.D., ABFM., CAQSM., AME. Primary Care and Sports Medicine [facility]  Adjunct Professor of [place] [employer] [facility] [employer]  Risk manager

## 2022-02-11 NOTE — Progress Notes (Signed)
   Established Patient Office Visit  Subjective   Patient ID: Victor Sandoval, male   DOB: [birth year] Age: 31 y.o. MRN: [id number]   Chief Complaint  Patient presents with   Follow-up    Mood   HPI Pleasant 31 year old male presenting today for follow-up on mood.  He was previously prescribed Lexapro 10 mg daily but has been off of this medication for the last 2 to 3 weeks.  He notes that he went for a weekend with friends and they were drinking alcohol so he did not want to drink along with the medication.  He stopped the medicine and found that he felt okay without it so he self discontinued.  The only symptom he has noted outside of his baseline anxiety/depressive symptoms is feeling lower energy and more tired than usual.  Notes that his depressive thoughts have actually been quite a bit less than they used to be and reports that he has had some positive changes in his life.  Denies SI/HI.   Objective:    Vitals:   02/12/22 1450  BP: 117/81  Pulse: 70  Height: 6' (1.829 m)  Weight: 253 lb (114.8 kg)  SpO2: 96%  BMI (Calculated): 34.31   Physical Exam Vitals reviewed.  Constitutional:      General: He is not in acute distress.    Appearance: Normal appearance. He is obese. He is not ill-appearing.  HENT:     Head: Normocephalic and atraumatic.  Cardiovascular:     Rate and Rhythm: Normal rate and regular rhythm.     Pulses: Normal pulses.     Heart sounds: Normal heart sounds. No murmur heard.    No friction rub. No gallop.  Pulmonary:     Effort: Pulmonary effort is normal. No respiratory distress.     Breath sounds: Normal breath sounds.  Skin:    General: Skin is warm and dry.  Neurological:     Mental Status: He is alert and oriented to person, place, and time.  Psychiatric:        Mood and Affect: Mood normal.        Behavior: Behavior normal.        Thought Content: Thought content normal.        Judgment: Judgment normal.   No results found for this or any  previous visit (from the past 24 hour(s)).     The ASCVD Risk score (Arnett DK, et al., 2019) failed to calculate for the following reasons:   The 2019 ASCVD risk score is only valid for ages 62 to 71   Assessment & Plan:   1. Anxiety with depression Discontinue Lexapro.  He does seem to have more depressive symptoms and little motivation/energy.  Discussed adding Wellbutrin.  He is agreeable to this and would like to give it a try so sending in Wellbutrin 150 mg daily.  Advised to monitor for worsening anxiety over the first couple of weeks but this should return to baseline once he adjusts to the medication.  Since we are going into the holidays, I will touch base with him via MyChart in a few weeks to see how he is doing.  Return in about 6 months (around 08/13/2022) for mood follow up.  ___________________________________________ Thayer Ohm, DNP, APRN, FNP-BC Primary Care and Sports Medicine [facility] [place]

## 2022-02-12 ENCOUNTER — Ambulatory Visit (INDEPENDENT_AMBULATORY_CARE_PROVIDER_SITE_OTHER): Payer: 59 | Admitting: Medical-Surgical

## 2022-02-12 ENCOUNTER — Encounter: Payer: Self-pay | Admitting: Medical-Surgical

## 2022-02-12 VITALS — BP 117/81 | HR 70 | Ht 72.0 in | Wt 253.0 lb

## 2022-02-12 DIAGNOSIS — F418 Other specified anxiety disorders: Secondary | ICD-10-CM

## 2022-02-12 MED ORDER — BUPROPION HCL ER (XL) 150 MG PO TB24: 150.0000 mg | ORAL_TABLET | Freq: Every day | ORAL | 1 refills | Status: DC

## 2022-02-22 ENCOUNTER — Ambulatory Visit (INDEPENDENT_AMBULATORY_CARE_PROVIDER_SITE_OTHER): Payer: 59 | Admitting: Sports Medicine

## 2022-02-22 DIAGNOSIS — S92155D Nondisplaced avulsion fracture (chip fracture) of left talus, subsequent encounter for fracture with routine healing: Secondary | ICD-10-CM | POA: Diagnosis not present

## 2022-02-22 NOTE — Progress Notes (Signed)
    Procedures performed today:    None.  Independent interpretation of notes and tests performed by another provider:   None.  Brief History, Exam, Impression, and Recommendations:    Avulsion fracture of left talus Approximately 5 to 6 weeks status post avulsion fracture dorsal talus, has been in a boot, out of the boot his strength is good, motion is good, no discrete areas of tenderness to palpation, able to jump up and down on the affected extremity, return as needed. I did advise him to do some home conditioning exercises for at least the next month.    ____________________________________________ Ihor Austin. Benjamin Stain, M.D., ABFM., CAQSM., AME. Primary Care and Sports Medicine [facility]  Adjunct Professor of Family Medicine  [place] [employer] [facility] [employer]  Restaurant manager, fast food

## 2022-02-22 NOTE — Assessment & Plan Note (Signed)
Approximately 5 to 6 weeks status post avulsion fracture dorsal talus, has been in a boot, out of the boot his strength is good, motion is good, no discrete areas of tenderness to palpation, able to jump up and down on the affected extremity, return as needed. I did advise him to do some home conditioning exercises for at least the next month.

## 2022-03-05 ENCOUNTER — Encounter: Payer: Self-pay | Admitting: Medical-Surgical

## 2022-03-08 MED ORDER — ESCITALOPRAM OXALATE 5 MG PO TABS: 5.0000 mg | ORAL_TABLET | Freq: Every day | ORAL | 3 refills | Status: DC

## 2022-04-13 ENCOUNTER — Other Ambulatory Visit: Payer: Self-pay | Admitting: Medical-Surgical

## 2022-04-16 ENCOUNTER — Other Ambulatory Visit: Payer: Self-pay | Admitting: Medical-Surgical

## 2022-08-13 ENCOUNTER — Ambulatory Visit (INDEPENDENT_AMBULATORY_CARE_PROVIDER_SITE_OTHER): Payer: 59 | Admitting: Medical-Surgical

## 2022-08-13 ENCOUNTER — Encounter: Payer: Self-pay | Admitting: Medical-Surgical

## 2022-08-13 VITALS — BP 130/82 | HR 91 | Resp 20 | Ht 72.0 in | Wt 253.3 lb

## 2022-08-13 DIAGNOSIS — F418 Other specified anxiety disorders: Secondary | ICD-10-CM

## 2022-08-13 DIAGNOSIS — Z113 Encounter for screening for infections with a predominantly sexual mode of transmission: Secondary | ICD-10-CM | POA: Diagnosis not present

## 2022-08-13 NOTE — Progress Notes (Signed)
        Established patient visit  History, exam, impression, and plan:  1. Anxiety with depression Reports 32 year old male presenting today with a history of anxiety and depression previously treated with Lexapro 5 mg daily and Wellbutrin 150 mg daily.  Tolerated the medications well without side effects and felt it worked well for him.  He has made multiple life changes over the last 6 months or so and decided to wean himself off the medications.  Has been able to come off without any significant withdrawal effects and feels that his mood is stable and he is in a good place.  He has had an issue with a prolonged break-up but is working through this as expected.  Currently happy with where he is mentally off the medication and is not interested in restarting.  Updating chart with discontinue medications today.  Advised should he feel it necessary, he is welcome to come back and see me to discuss restarting.  2. Routine screening for STI (sexually transmitted infection) After having the prolonged break-up with his partner, he would like to have STI testing.  No known exposures and no concerning symptoms.  Would like to have a full workup prior to starting another relationship.  Labs ordered today. - Trichomonas vaginalis RNA, Ql,Males - RPR - C. trachomatis/N. gonorrhoeae RNA - Hepatitis B surface antigen - Hepatitis C antibody - HIV Antibody (routine testing w rflx)  Procedures performed this visit: None.  Return in about 1 year (around 08/13/2023) for annual physical exam.  __________________________________ Thayer Ohm, DNP, APRN, FNP-BC Primary Care and Sports Medicine [facility]

## 2022-08-14 LAB — HIV ANTIBODY (ROUTINE TESTING W REFLEX): HIV 1&2 Ab, 4th Generation: NONREACTIVE

## 2022-08-14 LAB — HEPATITIS C ANTIBODY: Hepatitis C Ab: NONREACTIVE

## 2022-08-14 LAB — HEPATITIS B SURFACE ANTIGEN: Hepatitis B Surface Ag: NONREACTIVE

## 2022-08-14 LAB — TRICHOMONAS VAGINALIS RNA, QL,MALES: Trichomonas vaginalis RNA: NOT DETECTED

## 2022-08-14 LAB — RPR: RPR Ser Ql: NONREACTIVE

## 2022-08-14 LAB — C. TRACHOMATIS/N. GONORRHOEAE RNA
C. trachomatis RNA, TMA: NOT DETECTED
N. gonorrhoeae RNA, TMA: NOT DETECTED

## 2023-03-21 ENCOUNTER — Ambulatory Visit
Admission: RE | Admit: 2023-03-21 | Discharge: 2023-03-21 | Disposition: A | Discharge status: Home / Self Care | Payer: 59 | Attending: Family Medicine | Admitting: Family Medicine

## 2023-03-21 ENCOUNTER — Other Ambulatory Visit: Payer: Self-pay

## 2023-03-21 DIAGNOSIS — J069 Acute upper respiratory infection, unspecified: Secondary | ICD-10-CM | POA: Diagnosis not present

## 2023-03-21 LAB — POCT RAPID STREP A (OFFICE): Rapid Strep A Screen: NEGATIVE

## 2023-03-21 MED ORDER — PREDNISONE 20 MG PO TABS: 40.0000 mg | ORAL_TABLET | Freq: Every day | ORAL | 0 refills | Status: DC

## 2023-03-21 MED ORDER — AMOXICILLIN 875 MG PO TABS: 875.0000 mg | ORAL_TABLET | Freq: Two times a day (BID) | ORAL | 0 refills | Status: DC

## 2023-03-21 NOTE — Discharge Instructions (Signed)
Drink lots of fluids May take over-the-counter cough or cold medicines as needed Take the prednisone once a day for 5 days If you do not see improvement over the next couple of days fill and take the amoxicillin See your doctor if not improving by next week

## 2023-03-21 NOTE — ED Provider Notes (Signed)
[facility]    CSN: [id number] Arrival date & time: 03/21/23  0930      History   Chief Complaint No chief complaint on file.   HPI Otto Spagnoli is a 32 y.o. male.   Patient states he has been sick for 6 going on 7 days.  Runny and stuffy nose, sinus drainage,'s postnasal drip, cough.  Usually clear mucus, sometimes thick or yellow.  He is here because he has a painful sore throat at night.  Every night it has been hurting.  When he wakes up in the morning it is painful and then gets slightly better throughout the day.  Would like strep testing.    Past Medical History:  Diagnosis Date   Depression    History of palpitations    Spontaneous pneumothorax 2021    Patient Active Problem List   Diagnosis Date Noted   Anxiety with depression 08/08/2021   PVC (premature ventricular contraction) 08/09/2020    Past Surgical History:  Procedure Laterality Date   KNEE SURGERY     MOUTH SURGERY     wisdom tooth and tongue tied   REFRACTIVE SURGERY         Home Medications    Prior to Admission medications   Medication Sig Start Date End Date Taking? Authorizing Provider  amoxicillin (AMOXIL) 875 MG tablet Take 1 tablet (875 mg total) by mouth 2 (two) times daily. 03/21/23  Yes Eustace Moore, MD  predniSONE (DELTASONE) 20 MG tablet Take 2 tablets (40 mg total) by mouth daily with breakfast. 03/21/23  Yes Eustace Moore, MD  Multiple Vitamin (MULTIVITAMIN) tablet Take 1 tablet by mouth daily.    [provider]    Family History Family History  Problem Relation Age of Onset   High blood pressure Maternal Grandmother    High blood pressure Maternal Grandfather    Skin cancer Maternal Grandfather    Clotting disorder Maternal Uncle    Healthy Mother    Healthy Father    Anxiety disorder Sister    ADD / ADHD Sister    Depression Sister    Colon cancer Neg Hx    Esophageal cancer Neg Hx    Stomach cancer Neg Hx    Rectal cancer Neg Hx      Social History Social History   Tobacco Use   Smoking status: Never   Smokeless tobacco: Never  Vaping Use   Vaping status: Never Used  Substance Use Topics   Alcohol use: Yes    Alcohol/week: 8.0 standard drinks of alcohol    Types: 8 Standard drinks or equivalent per week   Drug use: Never     Allergies   Patient has no known allergies.   Review of Systems Review of Systems  See HPI Physical Exam Triage Vital Signs ED Triage Vitals  Encounter Vitals Group     BP 03/21/23 1031 (!) 134/99     Systolic BP Percentile --      Diastolic BP Percentile --      Pulse Rate 03/21/23 1031 92     Resp 03/21/23 1031 16     Temp 03/21/23 1031 98.5 F (36.9 C)     Temp Source 03/21/23 1031 Oral     SpO2 03/21/23 1031 97 %     Weight --      Height --      Head Circumference --      Peak Flow --      Pain Score  03/21/23 1034 3     Pain Loc --      Pain Education --      Exclude from Growth Chart --    No data found.  Updated Vital Signs BP (!) 134/99   Pulse 92   Temp 98.5 F (36.9 C) (Oral)   Resp 16   SpO2 97%   Physical Exam Constitutional:      General: He is not in acute distress.    Appearance: He is well-developed. He is obese. He is ill-appearing.  HENT:     Head: Normocephalic and atraumatic.     Right Ear: Tympanic membrane normal.     Left Ear: Tympanic membrane normal.     Nose: Congestion and rhinorrhea present.     Mouth/Throat:     Pharynx: Posterior oropharyngeal erythema present.     Comments: There exists posterior pharyngeal drainage, erythema of posterior pharynx Eyes:     Conjunctiva/sclera: Conjunctivae normal.     Pupils: Pupils are equal, round, and reactive to light.  Cardiovascular:     Rate and Rhythm: Normal rate and regular rhythm.     Heart sounds: Normal heart sounds.  Pulmonary:     Effort: Pulmonary effort is normal. No respiratory distress.     Breath sounds: Normal breath sounds.  Abdominal:     General: There is  no distension.     Palpations: Abdomen is soft.  Musculoskeletal:        General: Normal range of motion.     Cervical back: Normal range of motion.  Lymphadenopathy:     Cervical: Cervical adenopathy present.  Skin:    General: Skin is warm and dry.  Neurological:     Mental Status: He is alert.      UC Treatments / Results  Labs (all labs ordered are listed, but only abnormal results are displayed) Labs Reviewed  POCT RAPID STREP A (OFFICE)    EKG   Radiology No results found.  Procedures Procedures (including critical care time)  Medications Ordered in UC Medications - No data to display  Initial Impression / Assessment and Plan / UC Course  I have reviewed the triage vital signs and the nursing notes.  Pertinent labs & imaging results that were available during my care of the patient were reviewed by me and considered in my medical decision making (see chart for details).     Reviewed that most infections are caused by viruses.  With the severe pain in his throat and posterior drainage we will give him prednisone for a few days.  He can fill and take the amoxicillin if he fails to improve Final Clinical Impressions(s) / UC Diagnoses   Final diagnoses:  Viral upper respiratory tract infection with cough     Discharge Instructions      Drink lots of fluids May take over-the-counter cough or cold medicines as needed Take the prednisone once a day for 5 days If you do not see improvement over the next couple of days fill and take the amoxicillin See your doctor if not improving by next week   ED Prescriptions     Medication Sig Dispense Auth. Provider   predniSONE (DELTASONE) 20 MG tablet Take 2 tablets (40 mg total) by mouth daily with breakfast. 10 tablet Eustace Moore, MD   amoxicillin (AMOXIL) 875 MG tablet Take 1 tablet (875 mg total) by mouth 2 (two) times daily. 14 tablet Eustace Moore, MD      PDMP not  reviewed this encounter.    Eustace Moore, MD 03/21/23 [id number]

## 2023-03-21 NOTE — ED Triage Notes (Signed)
C/o sore throat x 1 week, worse at night. Has sinus pressure also. Feels like has hot flashes at night but not documented fever. Has been using cough drops, dayquil.

## 2023-11-26 ENCOUNTER — Telehealth: Payer: Self-pay

## 2023-11-26 ENCOUNTER — Encounter: Payer: Self-pay | Admitting: Sports Medicine

## 2023-11-26 NOTE — Telephone Encounter (Signed)
 Copied from CRM [id number]. Topic: Clinical - Medical Advice >> Nov 26, 2023  2:05 PM Corin V wrote: Reason for CRM: Patient cut his foot on a rusted nail yesterday and he wants to see if his tetanus shot is up to date or if he should get a new one. No deep piercing of skin or bleeding that is worrisome to him, Described as a small scrape. Call back: [phone]

## 2023-11-27 NOTE — Telephone Encounter (Signed)
 Called and left a detailed voice mail message on listed cell #

## 2024-04-24 ENCOUNTER — Ambulatory Visit
Admission: EM | Admit: 2024-04-24 | Discharge: 2024-04-24 | Disposition: A | Discharge status: Home / Self Care | Attending: Family Medicine | Admitting: Family Medicine

## 2024-04-24 DIAGNOSIS — J029 Acute pharyngitis, unspecified: Secondary | ICD-10-CM | POA: Diagnosis present

## 2024-04-24 LAB — POCT RAPID STREP A (OFFICE): Rapid Strep A Screen: NEGATIVE

## 2024-04-24 MED ORDER — PENICILLIN V POTASSIUM 500 MG PO TABS: ORAL_TABLET | ORAL | 0 refills | Status: AC

## 2024-04-24 NOTE — ED Triage Notes (Signed)
 Pt has c/o sore throat, bilateral ear pain, and swollen lymph nodes x 3 days. Pt hasn't taken any medications at home.

## 2024-04-24 NOTE — ED Provider Notes (Addendum)
 " [facility]  CSN: [id number] Arrival date & time: 04/24/24  1038      History   Chief Complaint Chief Complaint  Patient presents with   Sore Throat    HPI Victor Sandoval is a 34 y.o. male.   About 3 to 4 days ago patient developed a sore throat (worse on right) that has persisted.  He has noticed painful swollen cervical lymph nodes and bilateral earache and thinks he may fever.  He denies cough and nasal congestion, and generally feels well otherwise.   The history is provided by the patient.    Past Medical History:  Diagnosis Date   Depression    History of palpitations    Spontaneous pneumothorax 2021    Patient Active Problem List   Diagnosis Date Noted   Anxiety with depression 08/08/2021   PVC (premature ventricular contraction) 08/09/2020    Past Surgical History:  Procedure Laterality Date   KNEE SURGERY     MOUTH SURGERY     wisdom tooth and tongue tied   REFRACTIVE  SURGERY         Home Medications    Prior to Admission medications  Medication Sig Start Date End Date Taking? Authorizing Provider  penicillin  v potassium (VEETID) 500 MG tablet Take one tab by mouth Q12 hours for 10 days 04/24/24  Yes Pauline Garnette LABOR, MD    Family History Family History  Problem Relation Age of Onset   High blood pressure Maternal Grandmother    High blood pressure Maternal Grandfather    Skin cancer Maternal Grandfather    Clotting disorder Maternal Uncle    Healthy Mother    Healthy Father    Anxiety disorder Sister    ADD / ADHD Sister    Depression Sister    Colon cancer Neg Hx    Esophageal cancer Neg Hx    Stomach cancer Neg Hx    Rectal cancer Neg Hx     Social History Social History[1]   Allergies   Patient has no known allergies.   Review of Systems Review of Systems + sore throat No cough No pleuritic pain No wheezing No nasal congestion No post-nasal drainage No sinus pain/pressure No itchy/red eyes ? earache No hemoptysis No SOB ? fever/chills No nausea No vomiting No abdominal pain No diarrhea No urinary symptoms No skin rash No fatigue No myalgias No headache   Physical Exam Triage Vital Signs ED Triage Vitals  Encounter Vitals Group     BP 04/24/24 1055 127/86     Girls Systolic BP Percentile --      Girls Diastolic BP Percentile --      Boys Systolic BP Percentile --      Boys Diastolic BP Percentile --      Pulse Rate 04/24/24 1055 (!) 112     Resp 04/24/24 1055 16     Temp 04/24/24 1055 99.6 F (37.6 C)     Temp Source 04/24/24 1055 Oral     SpO2 04/24/24 1055 95 %     Weight --      Height --      Head Circumference --      Peak Flow --      Pain Score 04/24/24 1053 3     Pain Loc --      Pain Education --      Exclude from Growth Chart --    No data found.  Updated Vital Signs BP 127/86 (BP Location: Right Arm)   Pulse ROLLEN)  112   Temp 99.6 F (37.6 C) (Oral)   Resp 16   SpO2 95%    Visual Acuity Right Eye Distance:   Left Eye Distance:   Bilateral Distance:    Right Eye Near:   Left Eye Near:    Bilateral Near:     Physical Exam Nursing notes and Vital Signs reviewed. Appearance:  Patient appears stated age, and in no acute distress Eyes:  Pupils are equal, round, and reactive to light and accomodation.  Extraocular movement is intact.  Conjunctivae are not inflamed  Ears:  Canals normal.  Tympanic membranes normal.  Nose:  Normal turbinates.  No sinus tenderness.  Pharynx:  Right pharynx mildly erythematous. Neck:  Supple.  Tonsillar nodes tender bilaterally. Lungs:  Clear to auscultation.  Breath sounds are equal.  Moving air well. Heart:  Regular rate and rhythm without murmurs, rubs, or gallops.  Abdomen:  Nontender without masses or hepatosplenomegaly.  Bowel sounds are present.   Extremities:  No edema.     UC Treatments / Results  Labs (all labs ordered are listed, but only abnormal results are displayed) Labs Reviewed  POCT RAPID STREP A (OFFICE) - Normal  CULTURE, GROUP A STREP [facility])    EKG   Radiology No results found.  Procedures Procedures (including critical care time)  Medications Ordered in UC Medications - No data to display  Initial Impression / Assessment and Plan / UC Course  I have reviewed the triage vital signs and the nursing notes.  Pertinent labs & imaging results that were available during my care of the patient were reviewed by me and considered in my medical decision making (see chart for details).    ?false negative rapid strep test. Throat culture pending.  Begin empiric penicillin  500mg  Q12hr (may stop if culture negative). Followup with Family Doctor if not improved in one week.   Final Clinical Impressions(s) / UC Diagnoses   Final diagnoses:  Pharyngitis, unspecified etiology     Discharge Instructions      Try warm salt water gargles for sore throat.  May take Ibuprofen 200mg , 4 tabs every 8  hours with food for sore throat, fever, etc. If your throat culture returns negative for strep, you may stop the antibiotic when your symptoms have cleared.     ED Prescriptions     Medication Sig Dispense Auth. Provider   penicillin  v potassium (VEETID) 500 MG tablet Take one tab by mouth Q12 hours for 10 days 20 tablet Pauline Garnette LABOR, MD          Pauline Garnette LABOR, MD 04/24/24 1205     [1]  Social History Tobacco Use   Smoking status: Never   Smokeless tobacco: Never  Vaping Use   Vaping status: Never Used  Substance Use Topics   Alcohol use: Yes    Alcohol/week: 8.0 standard drinks of alcohol    Types: 8 Standard drinks or equivalent per week   Drug use: Never     Pauline Garnette LABOR, MD 04/24/24 1347  "

## 2024-04-24 NOTE — Discharge Instructions (Signed)
 Try warm salt water gargles for sore throat.  May take Ibuprofen 200mg , 4 tabs every 8 hours with food for sore throat, fever, etc. If your throat culture returns negative for strep, you may stop the antibiotic when your symptoms have cleared.

## 2024-04-28 LAB — CULTURE, GROUP A STREP (THRC)
